# Patient Record
Sex: Female | Born: 1940 | Race: White | Hispanic: No | Marital: Married | Smoking: Former smoker
Health system: Southern US, Community
[De-identification: ages and names within clinical notes are randomized; demographics above are authoritative.]

## PROBLEM LIST (undated history)

## (undated) DIAGNOSIS — E039 Hypothyroidism, unspecified: Secondary | ICD-10-CM

## (undated) DIAGNOSIS — R519 Headache, unspecified: Secondary | ICD-10-CM

## (undated) DIAGNOSIS — R112 Nausea with vomiting, unspecified: Secondary | ICD-10-CM

## (undated) DIAGNOSIS — Z8611 Personal history of tuberculosis: Secondary | ICD-10-CM

## (undated) DIAGNOSIS — R3915 Urgency of urination: Secondary | ICD-10-CM

## (undated) DIAGNOSIS — H353190 Nonexudative age-related macular degeneration, unspecified eye, stage unspecified: Secondary | ICD-10-CM

## (undated) DIAGNOSIS — F419 Anxiety disorder, unspecified: Secondary | ICD-10-CM

## (undated) DIAGNOSIS — Z9889 Other specified postprocedural states: Secondary | ICD-10-CM

## (undated) DIAGNOSIS — M2550 Pain in unspecified joint: Secondary | ICD-10-CM

## (undated) DIAGNOSIS — M542 Cervicalgia: Secondary | ICD-10-CM

## (undated) DIAGNOSIS — F329 Major depressive disorder, single episode, unspecified: Secondary | ICD-10-CM

## (undated) DIAGNOSIS — L570 Actinic keratosis: Secondary | ICD-10-CM

## (undated) DIAGNOSIS — M1712 Unilateral primary osteoarthritis, left knee: Secondary | ICD-10-CM

## (undated) DIAGNOSIS — G629 Polyneuropathy, unspecified: Secondary | ICD-10-CM

## (undated) DIAGNOSIS — E7800 Pure hypercholesterolemia, unspecified: Secondary | ICD-10-CM

## (undated) DIAGNOSIS — Z8669 Personal history of other diseases of the nervous system and sense organs: Secondary | ICD-10-CM

## (undated) DIAGNOSIS — H269 Unspecified cataract: Secondary | ICD-10-CM

## (undated) DIAGNOSIS — R51 Headache: Secondary | ICD-10-CM

## (undated) DIAGNOSIS — Z86010 Personal history of colonic polyps: Secondary | ICD-10-CM

## (undated) DIAGNOSIS — R42 Dizziness and giddiness: Secondary | ICD-10-CM

## (undated) HISTORY — PX: COLONOSCOPY: SHX174

## (undated) HISTORY — DX: Actinic keratosis: L57.0

---
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
## 1967-12-18 HISTORY — PX: ECTOPIC PREGNANCY SURGERY: SHX613

## 1970-12-17 HISTORY — PX: OVARIAN CYST REMOVAL: SHX89

## 1975-12-18 HISTORY — PX: VAGINAL HYSTERECTOMY: SHX2639

## 1982-12-17 HISTORY — PX: CHOLECYSTECTOMY: SHX55

## 2001-04-14 ENCOUNTER — Encounter: Payer: Self-pay | Admitting: Internal Medicine

## 2001-04-14 ENCOUNTER — Ambulatory Visit (HOSPITAL_COMMUNITY): Admission: RE | Admit: 2001-04-14 | Discharge: 2001-04-14 | Payer: Self-pay | Admitting: Internal Medicine

## 2001-04-18 ENCOUNTER — Encounter: Payer: Self-pay | Admitting: Internal Medicine

## 2005-12-27 ENCOUNTER — Ambulatory Visit: Payer: Self-pay | Admitting: Specialist

## 2006-04-08 ENCOUNTER — Ambulatory Visit: Payer: Self-pay | Admitting: Specialist

## 2006-12-17 HISTORY — PX: KNEE ARTHROSCOPY W/ MENISCECTOMY: SHX1879

## 2008-06-22 ENCOUNTER — Ambulatory Visit: Payer: Self-pay | Admitting: Specialist

## 2008-09-21 ENCOUNTER — Ambulatory Visit: Payer: Self-pay | Admitting: Specialist

## 2009-06-29 ENCOUNTER — Ambulatory Visit: Payer: Self-pay | Admitting: Gastroenterology

## 2010-01-31 ENCOUNTER — Ambulatory Visit: Payer: Self-pay | Admitting: Internal Medicine

## 2011-09-17 ENCOUNTER — Other Ambulatory Visit: Payer: Self-pay | Admitting: Internal Medicine

## 2011-09-17 MED ORDER — VENLAFAXINE HCL ER 75 MG PO CP24: 75.0000 mg | ORAL_CAPSULE | Freq: Every day | ORAL | Status: DC

## 2011-11-13 ENCOUNTER — Ambulatory Visit: Payer: Self-pay | Admitting: Specialist

## 2012-02-12 ENCOUNTER — Other Ambulatory Visit: Payer: Self-pay | Admitting: Internal Medicine

## 2012-02-12 MED ORDER — VENLAFAXINE HCL ER 75 MG PO CP24: 75.0000 mg | ORAL_CAPSULE | Freq: Every day | ORAL | Status: DC

## 2012-02-14 ENCOUNTER — Other Ambulatory Visit: Payer: Self-pay | Admitting: Internal Medicine

## 2012-06-25 ENCOUNTER — Other Ambulatory Visit: Payer: Self-pay | Admitting: Internal Medicine

## 2012-12-01 ENCOUNTER — Other Ambulatory Visit: Payer: Self-pay | Admitting: Internal Medicine

## 2013-04-17 ENCOUNTER — Ambulatory Visit: Payer: Self-pay | Admitting: Specialist

## 2013-04-28 ENCOUNTER — Ambulatory Visit: Payer: Self-pay | Admitting: Specialist

## 2014-04-24 ENCOUNTER — Observation Stay: Payer: Self-pay | Admitting: Internal Medicine

## 2014-04-24 LAB — COMPREHENSIVE METABOLIC PANEL
AST: 34 U/L (ref 15–37)
Albumin: 3.3 g/dL — ABNORMAL LOW (ref 3.4–5.0)
Alkaline Phosphatase: 64 U/L
Anion Gap: 6 — ABNORMAL LOW (ref 7–16)
BILIRUBIN TOTAL: 0.4 mg/dL (ref 0.2–1.0)
BUN: 17 mg/dL (ref 7–18)
CO2: 27 mmol/L (ref 21–32)
Calcium, Total: 9.1 mg/dL (ref 8.5–10.1)
Chloride: 108 mmol/L — ABNORMAL HIGH (ref 98–107)
Creatinine: 0.94 mg/dL (ref 0.60–1.30)
EGFR (Non-African Amer.): 60 — ABNORMAL LOW
Glucose: 126 mg/dL — ABNORMAL HIGH (ref 65–99)
OSMOLALITY: 284 (ref 275–301)
Potassium: 4.2 mmol/L (ref 3.5–5.1)
SGPT (ALT): 26 U/L (ref 12–78)
Sodium: 141 mmol/L (ref 136–145)
Total Protein: 6.6 g/dL (ref 6.4–8.2)

## 2014-04-24 LAB — URINALYSIS, COMPLETE
BILIRUBIN, UR: NEGATIVE
BLOOD: NEGATIVE
Glucose,UR: NEGATIVE mg/dL (ref 0–75)
KETONE: NEGATIVE
Nitrite: NEGATIVE
Ph: 6 (ref 4.5–8.0)
Protein: NEGATIVE
SPECIFIC GRAVITY: 1.009 (ref 1.003–1.030)
WBC UR: 1 /HPF (ref 0–5)

## 2014-04-24 LAB — CBC
HCT: 34.6 % — ABNORMAL LOW (ref 35.0–47.0)
HGB: 11.1 g/dL — ABNORMAL LOW (ref 12.0–16.0)
MCH: 29.9 pg (ref 26.0–34.0)
MCHC: 32.1 g/dL (ref 32.0–36.0)
MCV: 93 fL (ref 80–100)
Platelet: 213 10*3/uL (ref 150–440)
RBC: 3.71 10*6/uL — AB (ref 3.80–5.20)
RDW: 13.3 % (ref 11.5–14.5)
WBC: 9 10*3/uL (ref 3.6–11.0)

## 2014-04-24 LAB — TROPONIN I
Troponin-I: <0.02 ng/mL
Troponin-I: <0.02 ng/mL

## 2014-06-16 DIAGNOSIS — R55 Syncope and collapse: Secondary | ICD-10-CM | POA: Diagnosis present

## 2014-09-08 ENCOUNTER — Encounter (HOSPITAL_COMMUNITY): Payer: Self-pay | Admitting: Physician Assistant

## 2014-09-08 DIAGNOSIS — F32A Depression, unspecified: Secondary | ICD-10-CM

## 2014-09-08 DIAGNOSIS — F329 Major depressive disorder, single episode, unspecified: Secondary | ICD-10-CM | POA: Diagnosis present

## 2014-09-08 DIAGNOSIS — M1712 Unilateral primary osteoarthritis, left knee: Secondary | ICD-10-CM | POA: Diagnosis present

## 2014-09-08 DIAGNOSIS — E7800 Pure hypercholesterolemia, unspecified: Secondary | ICD-10-CM | POA: Diagnosis present

## 2014-09-08 DIAGNOSIS — E039 Hypothyroidism, unspecified: Secondary | ICD-10-CM | POA: Diagnosis present

## 2014-09-08 DIAGNOSIS — K648 Other hemorrhoids: Secondary | ICD-10-CM | POA: Diagnosis present

## 2014-09-08 HISTORY — DX: Depression, unspecified: F32.A

## 2014-09-08 NOTE — H&P (Signed)
TOTAL KNEE ADMISSION H&P  Patient is being admitted for left total knee arthroplasty.  Subjective:  Chief Complaint:left knee pain.  HPI: Shelley Sherman, 73 y.o. female, has a history of pain and functional disability in the left knee due to arthritis and has failed non-surgical conservative treatments for greater than 12 weeks to includeNSAID's and/or analgesics, corticosteriod injections, viscosupplementation injections, flexibility and strengthening excercises, supervised PT with diminished ADL's post treatment, use of assistive devices, weight reduction as appropriate and activity modification.  Onset of symptoms was gradual, starting 10 years ago with gradually worsening course since that time. The patient noted prior procedures on the knee to include  arthroscopy and menisectomy on the left knee(s).  Patient currently rates pain in the left knee(s) at 10 out of 10 with activity. Patient has night pain, worsening of pain with activity and weight bearing, pain that interferes with activities of daily living, pain with passive range of motion, crepitus and joint swelling.  Patient has evidence of subchondral sclerosis, periarticular osteophytes and joint space narrowing by imaging studies. There is no active infection.  Patient Active Problem List   Diagnosis Date Noted  . Depression 09/08/2014  . Primary localized osteoarthritis of left knee   . Internal hemorrhoid   . Hypercholesteremia   . Hypothyroidism   . Syncope and collapse 06/16/2014   Past Medical History  Diagnosis Date  . Hypothyroidism   . Hypercholesteremia   . Anxiety   . Cataract   . Internal hemorrhoid   . Syncope and collapse July 2015    Complete work up negative except for dehydration  . Primary localized osteoarthritis of left knee   . Depression 09/08/2014    Past Surgical History  Procedure Laterality Date  . Tonsilectomy, adenoidectomy, bilateral myringotomy and tubes  1947  . Ectopic pregnancy surgery   1969  . Ovarian cyst removal  1972  . Vaginal hysterectomy  1977  . Cholecystectomy  1984  . Knee arthroscopy w/ meniscectomy Left 2008    No prescriptions prior to admission   Allergies  Allergen Reactions  . Sulfa Antibiotics Rash    History  Substance Use Topics  . Smoking status: Current Every Day Smoker -- 0.25 packs/day    Types: Cigarettes  . Smokeless tobacco: Not on file  . Alcohol Use: Not on file     Comment: occasionally    Family History  Problem Relation Age of Onset  . Seizures Sister      Review of Systems  Constitutional: Negative.   Eyes: Negative.   Cardiovascular: Negative.   Gastrointestinal: Negative.   Genitourinary: Negative.   Musculoskeletal: Positive for joint pain.  Skin: Negative.   Neurological: Negative.   Endo/Heme/Allergies: Negative.   Psychiatric/Behavioral: Negative.     Objective:  Physical Exam  Constitutional: She is oriented to person, place, and time. She appears well-developed and well-nourished.  HENT:  Head: Normocephalic and atraumatic.  Mouth/Throat: Oropharynx is clear and moist.  Eyes: Conjunctivae and EOM are normal. Pupils are equal, round, and reactive to light.  Neck: Neck supple.  Cardiovascular: Normal rate, regular rhythm and normal heart sounds.   Respiratory: Effort normal and breath sounds normal.  GI: Soft. Bowel sounds are normal.  Genitourinary:  Not pertinent to current symptomatology therefore not examined.  Musculoskeletal:  Examination of her right knee reveals pain medially and laterally mild varus deformity 1+ crepitation 1+ synovitis range of motion -5 to 125 degrees knee is stable with normal patella tracking. Exam of her left  knee reveal moderate pain minimal swelling full range of motion knee is stable with normal patella tracking. Vascular exam: pulses 2+ and symmetric.  Neurological: She is alert and oriented to person, place, and time.  Skin: Skin is warm and dry.  Psychiatric: She has a  normal mood and affect. Her behavior is normal.    Vital signs in last 24 hours: Temp:  [97.8 F (36.6 C)] 97.8 F (36.6 C) (09/23 0900) Pulse Rate:  [73] 73 (09/23 0900) BP: (121)/(82) 121/82 mmHg (09/23 0900) SpO2:  [98 %] 98 % (09/23 0900) Weight:  [72.122 kg (159 lb)] 72.122 kg (159 lb) (09/23 0900)  Labs:   Estimated body mass index is 25.68 kg/(m^2) as calculated from the following:   Height as of this encounter: 5\' 6"  (1.676 m).   Weight as of this encounter: 72.122 kg (159 lb).   Imaging Review Plain radiographs demonstrate severe degenerative joint disease of the left knee(s). The overall alignment ismild varus. The bone quality appears to be good for age and reported activity level.  Assessment/Plan:  End stage arthritis, left knee  Principal Problem:   Primary localized osteoarthritis of left knee Active Problems:   Syncope and collapse   Internal hemorrhoid   Hypercholesteremia   Hypothyroidism   Depression  The patient history, physical examination, clinical judgment of the provider and imaging studies are consistent with end stage degenerative joint disease of the left knee(s) and total knee arthroplasty is deemed medically necessary. The treatment options including medical management, injection therapy arthroscopy and arthroplasty were discussed at length. The risks and benefits of total knee arthroplasty were presented and reviewed. The risks due to aseptic loosening, infection, stiffness, patella tracking problems, thromboembolic complications and other imponderables were discussed. The patient acknowledged the explanation, agreed to proceed with the plan and consent was signed. Patient is being admitted for inpatient treatment for surgery, pain control, PT, OT, prophylactic antibiotics, VTE prophylaxis, progressive ambulation and ADL's and discharge planning. The patient is planning to be discharged home with home health services  Wolfgang Finigan A. Kaleen Mask Physician Assistant [facility] [phone]  09/08/2014, 9:56 AM

## 2014-09-10 ENCOUNTER — Ambulatory Visit (HOSPITAL_COMMUNITY)
Admission: RE | Admit: 2014-09-10 | Discharge: 2014-09-10 | Disposition: A | Discharge status: Home / Self Care | Payer: Medicare Other | Source: Ambulatory Visit | Attending: Physician Assistant | Admitting: Physician Assistant

## 2014-09-10 ENCOUNTER — Encounter (HOSPITAL_COMMUNITY): Payer: Self-pay | Admitting: Pharmacy Technician

## 2014-09-10 ENCOUNTER — Encounter (HOSPITAL_COMMUNITY): Payer: Self-pay

## 2014-09-10 ENCOUNTER — Encounter (HOSPITAL_COMMUNITY)
Admission: RE | Admit: 2014-09-10 | Discharge: 2014-09-10 | Disposition: A | Discharge status: Home / Self Care | Payer: Medicare Other | Source: Ambulatory Visit | Attending: Orthopedic Surgery | Admitting: Orthopedic Surgery

## 2014-09-10 DIAGNOSIS — Z01818 Encounter for other preprocedural examination: Secondary | ICD-10-CM | POA: Insufficient documentation

## 2014-09-10 HISTORY — DX: Other specified postprocedural states: R11.2

## 2014-09-10 HISTORY — DX: Nonexudative age-related macular degeneration, unspecified eye, stage unspecified: H35.3190

## 2014-09-10 HISTORY — DX: Unspecified cataract: H26.9

## 2014-09-10 HISTORY — DX: Other specified postprocedural states: Z98.890

## 2014-09-10 HISTORY — DX: Personal history of other diseases of the nervous system and sense organs: Z86.69

## 2014-09-10 HISTORY — DX: Pain in unspecified joint: M25.50

## 2014-09-10 HISTORY — DX: Urgency of urination: R39.15

## 2014-09-10 HISTORY — DX: Personal history of colonic polyps: Z86.010

## 2014-09-10 HISTORY — DX: Dizziness and giddiness: R42

## 2014-09-10 HISTORY — DX: Polyneuropathy, unspecified: G62.9

## 2014-09-10 LAB — COMPREHENSIVE METABOLIC PANEL
ALT: 19 U/L (ref 0–35)
AST: 20 U/L (ref 0–37)
Albumin: 4.3 g/dL (ref 3.5–5.2)
Alkaline Phosphatase: 82 U/L (ref 39–117)
Anion gap: 12 (ref 5–15)
BUN: 14 mg/dL (ref 6–23)
CALCIUM: 9.9 mg/dL (ref 8.4–10.5)
CO2: 28 meq/L (ref 19–32)
CREATININE: 0.72 mg/dL (ref 0.50–1.10)
Chloride: 101 mEq/L (ref 96–112)
GFR calc Af Amer: >90 mL/min (ref >90)
GFR, EST NON AFRICAN AMERICAN: 83 mL/min — AB (ref >90)
Glucose, Bld: 92 mg/dL (ref 70–99)
Potassium: 3.9 mEq/L (ref 3.7–5.3)
Sodium: 141 mEq/L (ref 137–147)
Total Bilirubin: 0.5 mg/dL (ref 0.3–1.2)
Total Protein: 7.6 g/dL (ref 6.0–8.3)

## 2014-09-10 LAB — URINALYSIS, ROUTINE W REFLEX MICROSCOPIC
Bilirubin Urine: NEGATIVE
GLUCOSE, UA: NEGATIVE mg/dL
Hgb urine dipstick: NEGATIVE
KETONES UR: NEGATIVE mg/dL
Nitrite: NEGATIVE
Protein, ur: NEGATIVE mg/dL
Specific Gravity, Urine: 1.014 (ref 1.005–1.030)
Urobilinogen, UA: 0.2 mg/dL (ref 0.0–1.0)
pH: 5 (ref 5.0–8.0)

## 2014-09-10 LAB — CBC WITH DIFFERENTIAL/PLATELET
Basophils Absolute: 0 10*3/uL (ref 0.0–0.1)
Basophils Relative: 0 % (ref 0–1)
EOS PCT: 2 % (ref 0–5)
Eosinophils Absolute: 0.1 10*3/uL (ref 0.0–0.7)
HEMATOCRIT: 40.9 % (ref 36.0–46.0)
Hemoglobin: 13.6 g/dL (ref 12.0–15.0)
LYMPHS ABS: 3.5 10*3/uL (ref 0.7–4.0)
LYMPHS PCT: 40 % (ref 12–46)
MCH: 30.6 pg (ref 26.0–34.0)
MCHC: 33.3 g/dL (ref 30.0–36.0)
MCV: 91.9 fL (ref 78.0–100.0)
MONO ABS: 0.7 10*3/uL (ref 0.1–1.0)
MONOS PCT: 8 % (ref 3–12)
Neutro Abs: 4.4 10*3/uL (ref 1.7–7.7)
Neutrophils Relative %: 50 % (ref 43–77)
Platelets: 249 10*3/uL (ref 150–400)
RBC: 4.45 MIL/uL (ref 3.87–5.11)
RDW: 13.1 % (ref 11.5–15.5)
WBC: 8.7 10*3/uL (ref 4.0–10.5)

## 2014-09-10 LAB — SURGICAL PCR SCREEN
MRSA, PCR: NEGATIVE
Staphylococcus aureus: NEGATIVE

## 2014-09-10 LAB — URINE MICROSCOPIC-ADD ON

## 2014-09-10 LAB — APTT: aPTT: 34 seconds (ref 24–37)

## 2014-09-10 LAB — TYPE AND SCREEN
ABO/RH(D): A POS
Antibody Screen: NEGATIVE

## 2014-09-10 LAB — PROTIME-INR
INR: 1.03 (ref 0.00–1.49)
PROTHROMBIN TIME: 13.5 s (ref 11.6–15.2)

## 2014-09-10 LAB — ABO/RH: ABO/RH(D): A POS

## 2014-09-10 MED ORDER — POVIDONE-IODINE 7.5 % EX SOLN: Freq: Once | CUTANEOUS | Status: DC

## 2014-09-10 MED ORDER — CHLORHEXIDINE GLUCONATE 4 % EX LIQD: 60.0000 mL | Freq: Once | CUTANEOUS | Status: DC

## 2014-09-10 MED ORDER — CEFAZOLIN SODIUM-DEXTROSE 2-3 GM-% IV SOLR: 2.0000 g | INTRAVENOUS | Status: DC

## 2014-09-10 NOTE — Pre-Procedure Instructions (Signed)
ELANA JIAN  09/10/2014   Your procedure is scheduled on:  Mon, Oct 5 @ 7:15 AM  Report to [facility]  at 5:30 AM.  Call this number if you have problems the morning of surgery: [phone]   Remember:   Do not eat food or drink liquids after midnight.   Take these medicines the morning of surgery with A SIP OF WATER: Synthroid(Levothyroxine) and Effexor(Venlafaxine)              Stop taking your Vitamins. No Goody's,BC's,Aleve,Aspirin,Ibuprofen,Fish Oil,or any Herbal Medications    Do not wear jewelry, make-up or nail polish.  Do not wear lotions, powders, or perfumes. You may wear deodorant.  Do not shave 48 hours prior to surgery.   Do not bring valuables to the hospital.  [facility] is not responsible                  for any belongings or valuables.               Contacts, dentures or bridgework may not be worn into surgery.  Leave suitcase in the car. After surgery it may be brought to your room.  For patients admitted to the hospital, discharge time is determined by your                treatment team.               Special Instructions:  [facility] - Preparing for Surgery  Before surgery, you can play an important role.  Because skin is not sterile, your skin needs to be as free of germs as possible.  You can reduce the number of germs on you skin by washing with CHG (chlorahexidine gluconate) soap before surgery.  CHG is an antiseptic cleaner which kills germs and bonds with the skin to continue killing germs even after washing.  Please DO NOT use if you have an allergy to CHG or antibacterial soaps.  If your skin becomes reddened/irritated stop using the CHG and inform your nurse when you arrive at Short Stay.  Do not shave (including legs and underarms) for at least 48 hours prior to the first CHG shower.  You may shave your face.  Please follow these instructions carefully:   1.  Shower with CHG Soap the night before surgery and the                                 morning of Surgery.  2.  If you choose to wash your hair, wash your hair first as usual with your       normal shampoo.  3.  After you shampoo, rinse your hair and body thoroughly to remove the                      Shampoo.  4.  Use CHG as you would any other liquid soap.  You can apply chg directly       to the skin and wash gently with scrungie or a clean washcloth.  5.  Apply the CHG Soap to your body ONLY FROM THE NECK DOWN.        Do not use on open wounds or open sores.  Avoid contact with your eyes,       ears, mouth and genitals (private parts).  Wash genitals (private parts)       with  your normal soap.  6.  Wash thoroughly, paying special attention to the area where your surgery        will be performed.  7.  Thoroughly rinse your body with warm water from the neck down.  8.  DO NOT shower/wash with your normal soap after using and rinsing off       the CHG Soap.  9.  Pat yourself dry with a clean towel.            10.  Wear clean pajamas.            11.  Place clean sheets on your bed the night of your first shower and do not        sleep with pets.  Day of Surgery  Do not apply any lotions/deoderants the morning of surgery.  Please wear clean clothes to the hospital/surgery center.     Please read over the following fact sheets that you were given: Pain Booklet, Coughing and Deep Breathing, Blood Transfusion Information, MRSA Information and Surgical Site Infection Prevention

## 2014-09-10 NOTE — Progress Notes (Addendum)
Pt doesn't have a cardiologist  Denies ever having an echo/stress test/heart cath  Dr.Javed Masoud is Medical Md  Denies CXR in past yr  EKG done 04-24-14 at [facility] but not showing up in epic to request

## 2014-09-11 LAB — URINE CULTURE
CULTURE: NO GROWTH
Colony Count: NO GROWTH

## 2014-09-19 MED ORDER — LACTATED RINGERS IV SOLN: INTRAVENOUS | Status: DC

## 2014-09-20 ENCOUNTER — Inpatient Hospital Stay (HOSPITAL_COMMUNITY): Payer: Medicare Other | Admitting: Anesthesiology

## 2014-09-20 ENCOUNTER — Encounter (HOSPITAL_COMMUNITY): Payer: Medicare Other | Admitting: Anesthesiology

## 2014-09-20 ENCOUNTER — Encounter (HOSPITAL_COMMUNITY)
Admission: RE | Disposition: A | Discharge status: Home Health | Payer: Self-pay | Source: Ambulatory Visit | Attending: Orthopedic Surgery

## 2014-09-20 ENCOUNTER — Inpatient Hospital Stay (HOSPITAL_COMMUNITY)
Admission: RE | Admit: 2014-09-20 | Discharge: 2014-09-21 | DRG: 470 | Disposition: A | Discharge status: Home Health | Payer: Medicare Other | Source: Ambulatory Visit | Attending: Orthopedic Surgery | Admitting: Orthopedic Surgery

## 2014-09-20 ENCOUNTER — Encounter (HOSPITAL_COMMUNITY): Payer: Self-pay | Admitting: Anesthesiology

## 2014-09-20 DIAGNOSIS — G629 Polyneuropathy, unspecified: Secondary | ICD-10-CM | POA: Diagnosis present

## 2014-09-20 DIAGNOSIS — K648 Other hemorrhoids: Secondary | ICD-10-CM | POA: Diagnosis present

## 2014-09-20 DIAGNOSIS — M1711 Unilateral primary osteoarthritis, right knee: Secondary | ICD-10-CM | POA: Diagnosis present

## 2014-09-20 DIAGNOSIS — E7800 Pure hypercholesterolemia, unspecified: Secondary | ICD-10-CM | POA: Diagnosis present

## 2014-09-20 DIAGNOSIS — E039 Hypothyroidism, unspecified: Secondary | ICD-10-CM | POA: Diagnosis present

## 2014-09-20 DIAGNOSIS — F329 Major depressive disorder, single episode, unspecified: Secondary | ICD-10-CM | POA: Diagnosis present

## 2014-09-20 DIAGNOSIS — M1710 Unilateral primary osteoarthritis, unspecified knee: Secondary | ICD-10-CM | POA: Diagnosis present

## 2014-09-20 DIAGNOSIS — F17210 Nicotine dependence, cigarettes, uncomplicated: Secondary | ICD-10-CM | POA: Diagnosis present

## 2014-09-20 DIAGNOSIS — R55 Syncope and collapse: Secondary | ICD-10-CM | POA: Diagnosis present

## 2014-09-20 DIAGNOSIS — Z881 Allergy status to other antibiotic agents status: Secondary | ICD-10-CM

## 2014-09-20 DIAGNOSIS — M1712 Unilateral primary osteoarthritis, left knee: Secondary | ICD-10-CM | POA: Diagnosis present

## 2014-09-20 DIAGNOSIS — F32A Depression, unspecified: Secondary | ICD-10-CM | POA: Diagnosis present

## 2014-09-20 DIAGNOSIS — F419 Anxiety disorder, unspecified: Secondary | ICD-10-CM | POA: Diagnosis present

## 2014-09-20 HISTORY — PX: TOTAL KNEE ARTHROPLASTY: SHX125

## 2014-09-20 HISTORY — DX: Pure hypercholesterolemia, unspecified: E78.00

## 2014-09-20 HISTORY — DX: Unilateral primary osteoarthritis, left knee: M17.12

## 2014-09-20 HISTORY — DX: Hypothyroidism, unspecified: E03.9

## 2014-09-20 HISTORY — DX: Major depressive disorder, single episode, unspecified: F32.9

## 2014-09-20 HISTORY — DX: Anxiety disorder, unspecified: F41.9

## 2014-09-20 LAB — CREATININE, SERUM
Creatinine, Ser: 0.72 mg/dL (ref 0.50–1.10)
GFR calc Af Amer: >90 mL/min (ref >90)
GFR calc non Af Amer: 83 mL/min — ABNORMAL LOW (ref >90)

## 2014-09-20 LAB — CBC
HEMATOCRIT: 35.1 % — AB (ref 36.0–46.0)
Hemoglobin: 11.6 g/dL — ABNORMAL LOW (ref 12.0–15.0)
MCH: 30.5 pg (ref 26.0–34.0)
MCHC: 33 g/dL (ref 30.0–36.0)
MCV: 92.4 fL (ref 78.0–100.0)
PLATELETS: 225 10*3/uL (ref 150–400)
RBC: 3.8 MIL/uL — AB (ref 3.87–5.11)
RDW: 13 % (ref 11.5–15.5)
WBC: 11.4 10*3/uL — AB (ref 4.0–10.5)

## 2014-09-20 SURGERY — ARTHROPLASTY, KNEE, TOTAL
Anesthesia: Regional | Site: Knee | Laterality: Left

## 2014-09-20 MED ORDER — VENLAFAXINE HCL ER 75 MG PO CP24
75.0000 mg | ORAL_CAPSULE | Freq: Every day | ORAL | Status: DC
  Administered 2014-09-20 – 2014-09-21 (×2): 75 mg via ORAL
  Filled 2014-09-20 (×3): qty 1

## 2014-09-20 MED ORDER — CEFAZOLIN SODIUM-DEXTROSE 2-3 GM-% IV SOLR
INTRAVENOUS | Status: DC | PRN
  Administered 2014-09-20: 2 g via INTRAVENOUS

## 2014-09-20 MED ORDER — FENTANYL CITRATE 0.05 MG/ML IJ SOLN
INTRAMUSCULAR | Status: DC | PRN
  Administered 2014-09-20 (×3): 50 ug via INTRAVENOUS
  Administered 2014-09-20: 100 ug via INTRAVENOUS

## 2014-09-20 MED ORDER — ACETAMINOPHEN 325 MG PO TABS: 650.0000 mg | ORAL_TABLET | Freq: Four times a day (QID) | ORAL | Status: DC | PRN

## 2014-09-20 MED ORDER — PNEUMOCOCCAL VAC POLYVALENT 25 MCG/0.5ML IJ INJ
0.5000 mL | INJECTION | INTRAMUSCULAR | Status: DC
  Filled 2014-09-20: qty 0.5

## 2014-09-20 MED ORDER — PHENOL 1.4 % MT LIQD: 1.0000 | OROMUCOSAL | Status: DC | PRN

## 2014-09-20 MED ORDER — DEXAMETHASONE SODIUM PHOSPHATE 10 MG/ML IJ SOLN
10.0000 mg | Freq: Three times a day (TID) | INTRAMUSCULAR | Status: DC
  Filled 2014-09-20 (×3): qty 1

## 2014-09-20 MED ORDER — ROCURONIUM BROMIDE 50 MG/5ML IV SOLN
INTRAVENOUS | Status: AC
  Filled 2014-09-20: qty 1

## 2014-09-20 MED ORDER — KETOROLAC TROMETHAMINE 15 MG/ML IJ SOLN
INTRAMUSCULAR | Status: DC | PRN
  Administered 2014-09-20: 15 mg via INTRAVENOUS

## 2014-09-20 MED ORDER — DEXAMETHASONE SODIUM PHOSPHATE 10 MG/ML IJ SOLN
INTRAMUSCULAR | Status: AC
  Filled 2014-09-20: qty 1

## 2014-09-20 MED ORDER — METOCLOPRAMIDE HCL 10 MG PO TABS: 5.0000 mg | ORAL_TABLET | Freq: Three times a day (TID) | ORAL | Status: DC | PRN

## 2014-09-20 MED ORDER — MIDAZOLAM HCL 5 MG/5ML IJ SOLN
INTRAMUSCULAR | Status: DC | PRN
  Administered 2014-09-20: 1 mg via INTRAVENOUS

## 2014-09-20 MED ORDER — BUPIVACAINE HCL (PF) 0.5 % IJ SOLN
INTRAMUSCULAR | Status: DC | PRN
  Administered 2014-09-20: 30 mL via PERINEURAL

## 2014-09-20 MED ORDER — INFLUENZA VAC SPLIT QUAD 0.5 ML IM SUSY
0.5000 mL | PREFILLED_SYRINGE | INTRAMUSCULAR | Status: AC
  Administered 2014-09-21: 0.5 mL via INTRAMUSCULAR
  Filled 2014-09-20: qty 0.5

## 2014-09-20 MED ORDER — ENOXAPARIN SODIUM 30 MG/0.3ML ~~LOC~~ SOLN
30.0000 mg | Freq: Two times a day (BID) | SUBCUTANEOUS | Status: DC
  Administered 2014-09-21: 30 mg via SUBCUTANEOUS
  Filled 2014-09-20 (×3): qty 0.3

## 2014-09-20 MED ORDER — METOCLOPRAMIDE HCL 5 MG/ML IJ SOLN: 5.0000 mg | Freq: Three times a day (TID) | INTRAMUSCULAR | Status: DC | PRN

## 2014-09-20 MED ORDER — CALCIUM CARBONATE 1250 (500 CA) MG PO TABS
1.0000 | ORAL_TABLET | Freq: Every day | ORAL | Status: DC
  Administered 2014-09-21: 500 mg via ORAL
  Filled 2014-09-20 (×2): qty 1

## 2014-09-20 MED ORDER — KETOROLAC TROMETHAMINE 15 MG/ML IJ SOLN
7.5000 mg | Freq: Four times a day (QID) | INTRAMUSCULAR | Status: AC
Start: 2014-09-20 — End: 2014-09-21
  Administered 2014-09-20 – 2014-09-21 (×3): 7.5 mg via INTRAVENOUS

## 2014-09-20 MED ORDER — PHENYLEPHRINE HCL 10 MG/ML IJ SOLN
10.0000 mg | INTRAVENOUS | Status: DC | PRN
  Administered 2014-09-20: 20 ug/min via INTRAVENOUS

## 2014-09-20 MED ORDER — HYDROMORPHONE HCL 1 MG/ML IJ SOLN
0.2500 mg | INTRAMUSCULAR | Status: DC | PRN
  Administered 2014-09-20 (×4): 0.5 mg via INTRAVENOUS

## 2014-09-20 MED ORDER — SODIUM CHLORIDE 0.9 % IR SOLN
Status: DC | PRN
  Administered 2014-09-20: 3000 mL

## 2014-09-20 MED ORDER — MAGNESIUM OXIDE 400 (241.3 MG) MG PO TABS
200.0000 mg | ORAL_TABLET | Freq: Every day | ORAL | Status: DC
  Administered 2014-09-20 – 2014-09-21 (×2): 200 mg via ORAL
  Filled 2014-09-20 (×2): qty 0.5

## 2014-09-20 MED ORDER — MENTHOL 3 MG MT LOZG: 1.0000 | LOZENGE | OROMUCOSAL | Status: DC | PRN

## 2014-09-20 MED ORDER — CALCIUM CARBONATE 1250 (500 CA) MG PO CAPS
1250.0000 mg | ORAL_CAPSULE | Freq: Every day | ORAL | Status: DC
  Filled 2014-09-20: qty 1

## 2014-09-20 MED ORDER — CEFAZOLIN SODIUM 1-5 GM-% IV SOLN
1.0000 g | Freq: Four times a day (QID) | INTRAVENOUS | Status: AC
  Administered 2014-09-20 (×2): 1 g via INTRAVENOUS
  Filled 2014-09-20 (×2): qty 50

## 2014-09-20 MED ORDER — HYDROXYZINE HCL 25 MG PO TABS: 25.0000 mg | ORAL_TABLET | Freq: Three times a day (TID) | ORAL | Status: DC | PRN

## 2014-09-20 MED ORDER — EPHEDRINE SULFATE 50 MG/ML IJ SOLN
INTRAMUSCULAR | Status: AC
  Filled 2014-09-20: qty 1

## 2014-09-20 MED ORDER — LACTATED RINGERS IV SOLN
INTRAVENOUS | Status: DC | PRN
  Administered 2014-09-20 (×2): via INTRAVENOUS

## 2014-09-20 MED ORDER — PRAVASTATIN SODIUM 40 MG PO TABS
40.0000 mg | ORAL_TABLET | Freq: Every day | ORAL | Status: DC
  Administered 2014-09-20: 40 mg via ORAL
  Filled 2014-09-20 (×2): qty 1

## 2014-09-20 MED ORDER — HYDROMORPHONE HCL 1 MG/ML IJ SOLN
INTRAMUSCULAR | Status: AC
  Filled 2014-09-20: qty 1

## 2014-09-20 MED ORDER — KETOROLAC TROMETHAMINE 15 MG/ML IJ SOLN
INTRAMUSCULAR | Status: AC
  Filled 2014-09-20: qty 1

## 2014-09-20 MED ORDER — OCUVITE PO TABS
1.0000 | ORAL_TABLET | Freq: Every day | ORAL | Status: DC
  Administered 2014-09-20 – 2014-09-21 (×2): 1 via ORAL
  Filled 2014-09-20 (×2): qty 1

## 2014-09-20 MED ORDER — SUCCINYLCHOLINE CHLORIDE 20 MG/ML IJ SOLN
INTRAMUSCULAR | Status: AC
  Filled 2014-09-20: qty 1

## 2014-09-20 MED ORDER — GLYCOPYRROLATE 0.2 MG/ML IJ SOLN
INTRAMUSCULAR | Status: DC | PRN
  Administered 2014-09-20: .4 mg via INTRAVENOUS

## 2014-09-20 MED ORDER — LEVOTHYROXINE SODIUM 75 MCG PO TABS
75.0000 ug | ORAL_TABLET | Freq: Every day | ORAL | Status: DC
  Administered 2014-09-21: 75 ug via ORAL
  Filled 2014-09-20 (×2): qty 1

## 2014-09-20 MED ORDER — SODIUM CHLORIDE 0.9 % IJ SOLN
INTRAMUSCULAR | Status: AC
  Filled 2014-09-20: qty 10

## 2014-09-20 MED ORDER — BUPIVACAINE-EPINEPHRINE 0.25% -1:200000 IJ SOLN
INTRAMUSCULAR | Status: DC | PRN
  Administered 2014-09-20: 30 mL

## 2014-09-20 MED ORDER — POTASSIUM CHLORIDE IN NACL 20-0.9 MEQ/L-% IV SOLN
INTRAVENOUS | Status: DC
  Administered 2014-09-20 – 2014-09-21 (×2): via INTRAVENOUS
  Filled 2014-09-20 (×5): qty 1000

## 2014-09-20 MED ORDER — MAGNESIUM 250 MG PO TABS: 250.0000 mg | ORAL_TABLET | Freq: Every day | ORAL | Status: DC

## 2014-09-20 MED ORDER — 0.9 % SODIUM CHLORIDE (POUR BTL) OPTIME
TOPICAL | Status: DC | PRN
  Administered 2014-09-20: 1000 mL

## 2014-09-20 MED ORDER — ONDANSETRON HCL 4 MG/2ML IJ SOLN
INTRAMUSCULAR | Status: AC
  Filled 2014-09-20: qty 2

## 2014-09-20 MED ORDER — LIDOCAINE HCL (CARDIAC) 20 MG/ML IV SOLN
INTRAVENOUS | Status: AC
  Filled 2014-09-20: qty 5

## 2014-09-20 MED ORDER — DEXAMETHASONE SODIUM PHOSPHATE 10 MG/ML IJ SOLN
INTRAMUSCULAR | Status: DC | PRN
  Administered 2014-09-20: 10 mg via INTRAVENOUS

## 2014-09-20 MED ORDER — NEOSTIGMINE METHYLSULFATE 10 MG/10ML IV SOLN
INTRAVENOUS | Status: DC | PRN
  Administered 2014-09-20: 3 mg via INTRAVENOUS

## 2014-09-20 MED ORDER — ACETAMINOPHEN 650 MG RE SUPP: 650.0000 mg | Freq: Four times a day (QID) | RECTAL | Status: DC | PRN

## 2014-09-20 MED ORDER — KETOROLAC TROMETHAMINE 30 MG/ML IJ SOLN
INTRAMUSCULAR | Status: AC
  Filled 2014-09-20: qty 1

## 2014-09-20 MED ORDER — BUPIVACAINE-EPINEPHRINE (PF) 0.25% -1:200000 IJ SOLN
INTRAMUSCULAR | Status: AC
  Filled 2014-09-20: qty 30

## 2014-09-20 MED ORDER — LIDOCAINE HCL (CARDIAC) 20 MG/ML IV SOLN
INTRAVENOUS | Status: DC | PRN
  Administered 2014-09-20: 70 mg via INTRAVENOUS

## 2014-09-20 MED ORDER — DIPHENHYDRAMINE HCL 12.5 MG/5ML PO ELIX
12.5000 mg | ORAL_SOLUTION | ORAL | Status: DC | PRN
  Administered 2014-09-20: 25 mg via ORAL
  Filled 2014-09-20: qty 10

## 2014-09-20 MED ORDER — FENTANYL CITRATE 0.05 MG/ML IJ SOLN
INTRAMUSCULAR | Status: AC
  Filled 2014-09-20: qty 5

## 2014-09-20 MED ORDER — ALUM & MAG HYDROXIDE-SIMETH 200-200-20 MG/5ML PO SUSP: 30.0000 mL | ORAL | Status: DC | PRN

## 2014-09-20 MED ORDER — POLYETHYLENE GLYCOL 3350 17 G PO PACK: 17.0000 g | PACK | Freq: Every day | ORAL | Status: DC | PRN

## 2014-09-20 MED ORDER — PROMETHAZINE HCL 25 MG/ML IJ SOLN: 6.2500 mg | INTRAMUSCULAR | Status: DC | PRN

## 2014-09-20 MED ORDER — PROPOFOL 10 MG/ML IV BOLUS
INTRAVENOUS | Status: AC
  Filled 2014-09-20: qty 20

## 2014-09-20 MED ORDER — MIDAZOLAM HCL 2 MG/2ML IJ SOLN
INTRAMUSCULAR | Status: AC
  Filled 2014-09-20: qty 2

## 2014-09-20 MED ORDER — ROCURONIUM BROMIDE 100 MG/10ML IV SOLN
INTRAVENOUS | Status: DC | PRN
  Administered 2014-09-20: 40 mg via INTRAVENOUS

## 2014-09-20 MED ORDER — DEXAMETHASONE SODIUM PHOSPHATE 4 MG/ML IJ SOLN
10.0000 mg | Freq: Three times a day (TID) | INTRAMUSCULAR | Status: DC
  Administered 2014-09-20 – 2014-09-21 (×3): 10 mg via INTRAVENOUS
  Filled 2014-09-20 (×6): qty 2.5

## 2014-09-20 MED ORDER — ONDANSETRON HCL 4 MG PO TABS: 4.0000 mg | ORAL_TABLET | Freq: Four times a day (QID) | ORAL | Status: DC | PRN

## 2014-09-20 MED ORDER — PROPOFOL 10 MG/ML IV BOLUS
INTRAVENOUS | Status: DC | PRN
  Administered 2014-09-20: 160 mg via INTRAVENOUS
  Administered 2014-09-20: 40 mg via INTRAVENOUS

## 2014-09-20 MED ORDER — ONDANSETRON HCL 4 MG/2ML IJ SOLN
4.0000 mg | Freq: Four times a day (QID) | INTRAMUSCULAR | Status: DC | PRN
  Administered 2014-09-20: 4 mg via INTRAVENOUS
  Filled 2014-09-20: qty 2

## 2014-09-20 MED ORDER — ALPRAZOLAM 0.25 MG PO TABS: 0.2500 mg | ORAL_TABLET | Freq: Every evening | ORAL | Status: DC | PRN

## 2014-09-20 MED ORDER — MORPHINE SULFATE 2 MG/ML IJ SOLN
2.0000 mg | INTRAMUSCULAR | Status: DC | PRN
  Administered 2014-09-20 – 2014-09-21 (×3): 2 mg via INTRAVENOUS
  Filled 2014-09-20 (×3): qty 1

## 2014-09-20 MED ORDER — ONDANSETRON HCL 4 MG/2ML IJ SOLN
INTRAMUSCULAR | Status: DC | PRN
  Administered 2014-09-20: 4 mg via INTRAVENOUS

## 2014-09-20 MED ORDER — DOCUSATE SODIUM 100 MG PO CAPS
100.0000 mg | ORAL_CAPSULE | Freq: Two times a day (BID) | ORAL | Status: DC
  Administered 2014-09-20 – 2014-09-21 (×3): 100 mg via ORAL
  Filled 2014-09-20 (×3): qty 1

## 2014-09-20 MED ORDER — NEOSTIGMINE METHYLSULFATE 10 MG/10ML IV SOLN
INTRAVENOUS | Status: AC
  Filled 2014-09-20: qty 1

## 2014-09-20 MED ORDER — GLYCOPYRROLATE 0.2 MG/ML IJ SOLN
INTRAMUSCULAR | Status: AC
  Filled 2014-09-20: qty 2

## 2014-09-20 SURGICAL SUPPLY — 71 items
APL SKNCLS STERI-STRIP NONHPOA (GAUZE/BANDAGES/DRESSINGS) ×1
BANDAGE ELASTIC 6 VELCRO ST LF (GAUZE/BANDAGES/DRESSINGS) ×1 IMPLANT
BANDAGE ESMARK 6X9 LF (GAUZE/BANDAGES/DRESSINGS) ×1 IMPLANT
BENZOIN TINCTURE PRP APPL 2/3 (GAUZE/BANDAGES/DRESSINGS) ×2 IMPLANT
BLADE SAGITTAL 25.0X1.19X90 (BLADE) ×2 IMPLANT
BLADE SAW SGTL 11.0X1.19X90.0M (BLADE) IMPLANT
BLADE SAW SGTL 13.0X1.19X90.0M (BLADE) ×2 IMPLANT
BLADE SURG 10 STRL SS (BLADE) ×4 IMPLANT
BNDG CMPR 9X6 STRL LF SNTH (GAUZE/BANDAGES/DRESSINGS) ×1
BNDG CMPR MED 15X6 ELC VLCR LF (GAUZE/BANDAGES/DRESSINGS) ×1
BNDG ELASTIC 6X15 VLCR STRL LF (GAUZE/BANDAGES/DRESSINGS) ×2 IMPLANT
BNDG ESMARK 6X9 LF (GAUZE/BANDAGES/DRESSINGS) ×2
BOWL SMART MIX CTS (DISPOSABLE) ×2 IMPLANT
CAPT RP KNEE ×2 IMPLANT
CEMENT HV SMART SET (Cement) ×4 IMPLANT
CLSR STERI-STRIP ANTIMIC 1/2X4 (GAUZE/BANDAGES/DRESSINGS) ×1 IMPLANT
COVER SURGICAL LIGHT HANDLE (MISCELLANEOUS) ×2 IMPLANT
CUFF TOURNIQUET SINGLE 34IN LL (TOURNIQUET CUFF) ×2 IMPLANT
CUFF TOURNIQUET SINGLE 44IN (TOURNIQUET CUFF) IMPLANT
DRAPE EXTREMITY T 121X128X90 (DRAPE) ×2 IMPLANT
DRAPE INCISE IOBAN 66X45 STRL (DRAPES) ×2 IMPLANT
DRAPE PROXIMA HALF (DRAPES) ×2 IMPLANT
DRAPE U-SHAPE 47X51 STRL (DRAPES) ×2 IMPLANT
DRSG AQUACEL AG ADV 3.5X14 (GAUZE/BANDAGES/DRESSINGS) ×3 IMPLANT
DRSG PAD ABDOMINAL 8X10 ST (GAUZE/BANDAGES/DRESSINGS) ×3 IMPLANT
DURAPREP 26ML APPLICATOR (WOUND CARE) ×4 IMPLANT
ELECT CAUTERY BLADE 6.4 (BLADE) ×2 IMPLANT
ELECT REM PT RETURN 9FT ADLT (ELECTROSURGICAL) ×2
ELECTRODE REM PT RTRN 9FT ADLT (ELECTROSURGICAL) ×1 IMPLANT
EVACUATOR 1/8 PVC DRAIN (DRAIN) ×2 IMPLANT
FACESHIELD WRAPAROUND (MASK) ×2 IMPLANT
FACESHIELD WRAPAROUND OR TEAM (MASK) ×1 IMPLANT
GAUZE SPONGE 4X4 12PLY STRL (GAUZE/BANDAGES/DRESSINGS) ×2 IMPLANT
GLOVE BIO SURGEON STRL SZ7 (GLOVE) ×4 IMPLANT
GLOVE BIOGEL PI IND STRL 7.0 (GLOVE) ×1 IMPLANT
GLOVE BIOGEL PI IND STRL 7.5 (GLOVE) ×1 IMPLANT
GLOVE BIOGEL PI INDICATOR 7.0 (GLOVE) ×1
GLOVE BIOGEL PI INDICATOR 7.5 (GLOVE) ×1
GLOVE SS BIOGEL STRL SZ 7.5 (GLOVE) ×1 IMPLANT
GLOVE SUPERSENSE BIOGEL SZ 7.5 (GLOVE) ×1
GLOVE SURG SS PI 7.0 STRL IVOR (GLOVE) ×2 IMPLANT
GOWN STRL REUS W/ TWL LRG LVL3 (GOWN DISPOSABLE) ×2 IMPLANT
GOWN STRL REUS W/ TWL XL LVL3 (GOWN DISPOSABLE) ×2 IMPLANT
GOWN STRL REUS W/TWL LRG LVL3 (GOWN DISPOSABLE) ×8
GOWN STRL REUS W/TWL XL LVL3 (GOWN DISPOSABLE) ×4
HANDPIECE INTERPULSE COAX TIP (DISPOSABLE) ×2
HOOD PEEL AWAY FACE SHEILD DIS (HOOD) ×4 IMPLANT
IMMOBILIZER KNEE 22 UNIV (SOFTGOODS) ×1 IMPLANT
KIT BASIN OR (CUSTOM PROCEDURE TRAY) ×2 IMPLANT
KIT ROOM TURNOVER OR (KITS) ×2 IMPLANT
MANIFOLD NEPTUNE II (INSTRUMENTS) ×2 IMPLANT
MARKER SKIN DUAL TIP RULER LAB (MISCELLANEOUS) ×2 IMPLANT
NS IRRIG 1000ML POUR BTL (IV SOLUTION) ×2 IMPLANT
PACK TOTAL JOINT (CUSTOM PROCEDURE TRAY) ×2 IMPLANT
PAD ARMBOARD 7.5X6 YLW CONV (MISCELLANEOUS) ×4 IMPLANT
PADDING CAST COTTON 6X4 STRL (CAST SUPPLIES) ×2 IMPLANT
RUBBERBAND STERILE (MISCELLANEOUS) ×2 IMPLANT
SET HNDPC FAN SPRY TIP SCT (DISPOSABLE) ×1 IMPLANT
STRIP CLOSURE SKIN 1/2X4 (GAUZE/BANDAGES/DRESSINGS) ×2 IMPLANT
SUCTION FRAZIER TIP 10 FR DISP (SUCTIONS) ×2 IMPLANT
SUT ETHIBOND NAB CT1 #1 30IN (SUTURE) ×4 IMPLANT
SUT MNCRL AB 3-0 PS2 18 (SUTURE) ×2 IMPLANT
SUT VIC AB 0 CT1 27 (SUTURE) ×4
SUT VIC AB 0 CT1 27XBRD ANBCTR (SUTURE) ×2 IMPLANT
SUT VIC AB 2-0 CT1 27 (SUTURE) ×4
SUT VIC AB 2-0 CT1 TAPERPNT 27 (SUTURE) ×2 IMPLANT
SYR 30ML SLIP (SYRINGE) ×2 IMPLANT
TOWEL OR 17X24 6PK STRL BLUE (TOWEL DISPOSABLE) ×2 IMPLANT
TOWEL OR 17X26 10 PK STRL BLUE (TOWEL DISPOSABLE) ×2 IMPLANT
TRAY FOLEY CATH 16FR SILVER (SET/KITS/TRAYS/PACK) ×2 IMPLANT
WATER STERILE IRR 1000ML POUR (IV SOLUTION) ×4 IMPLANT

## 2014-09-20 NOTE — Interval H&P Note (Signed)
History and Physical Interval Note:  09/20/2014 7:06 AM  Shelley Sherman  has presented today for surgery, with the diagnosis of Primary localized osteoarthritis DJD LEFT KNEE  The various methods of treatment have been discussed with the patient and family. After consideration of risks, benefits and other options for treatment, the patient has consented to  Procedure(s): LEFT TOTAL KNEE ARTHROPLASTY (Left) as a surgical intervention .  The patient's history has been reviewed, patient examined, no change in status, stable for surgery.  I have reviewed the patient's chart and labs.  Questions were answered to the patient's satisfaction.     Elsie Saas A

## 2014-09-20 NOTE — Op Note (Signed)
MRN:     [id number] DOB/AGE:    74-Jun-1942 / 74 y.o.       OPERATIVE REPORT    DATE OF PROCEDURE:  09/20/2014       PREOPERATIVE DIAGNOSIS:   DJD LEFT KNEE      Estimated body mass index is 26.13 kg/(m^2) as calculated from the following:   Height as of this encounter: 5' 5.5" (1.664 m).   Weight as of this encounter: 72.349 kg (159 lb 8 oz).                                                        POSTOPERATIVE DIAGNOSIS:  Primary localized osteoarthritis DJD LEFT KNEE                                                                      PROCEDURE:  Procedure(s): LEFT TOTAL KNEE ARTHROPLASTY Using Depuy Sigma RP implants #2.5 Femur, #3Tibia, 63mm sigma RP bearing, 32 Patella     SURGEON: Sheree Lalla A    ASSISTANT:  Kirstin Shepperson PA-C   (Present and scrubbed throughout the case, critical for assistance with exposure, retraction, instrumentation, and closure.)         ANESTHESIA: GET with Femoral Nerve Block  DRAINS: foley, 2 medium hemovac in knee   TOURNIQUET TIME: 95AOZ   COMPLICATIONS:  None     SPECIMENS: None   INDICATIONS FOR PROCEDURE: The patient has  DJD LEFT KNEE, varus deformities, XR shows bone on bone arthritis. Patient has failed all conservative measures including anti-inflammatory medicines, narcotics, attempts at  exercise and weight loss, cortisone injections and viscosupplementation.  Risks and benefits of surgery have been discussed, questions answered.   DESCRIPTION OF PROCEDURE: The patient identified by armband, received  right femoral nerve block and IV antibiotics, in the holding area at [facility]. Patient taken to the operating room, appropriate anesthetic  monitors were attached General endotracheal anesthesia induced with  the patient in supine position, Foley catheter was inserted. Tourniquet  applied high to the operative thigh. Lateral post and foot positioner  applied to the table, the lower extremity was then prepped and draped  in  usual sterile fashion from the ankle to the tourniquet. Time-out procedure was performed. The limb was wrapped with an Esmarch bandage and the tourniquet inflated to 365 mmHg. We began the operation by making the anterior midline incision starting at handbreadth above the patella going over the patella 1 cm medial to and  4 cm distal to the tibial tubercle. Small bleeders in the skin and the  subcutaneous tissue identified and cauterized. Transverse retinaculum was incised and reflected medially and a medial parapatellar arthrotomy was accomplished. the patella was everted and theprepatellar fat pad resected. The superficial medial collateral  ligament was then elevated from anterior to posterior along the proximal  flare of the tibia and anterior half of the menisci resected. The knee was hyperflexed exposing bone on bone arthritis. Peripheral and notch osteophytes as well as the cruciate ligaments were then resected. We continued to  work our way around posteriorly along  the proximal tibia, and externally  rotated the tibia subluxing it out from underneath the femur. A McHale  retractor was placed through the notch and a lateral Hohmann retractor  placed, and we then drilled through the proximal tibia in line with the  axis of the tibia followed by an intramedullary guide rod and 2-degree  posterior slope cutting guide. The tibial cutting guide was pinned into place  allowing resection of 4 mm of bone medially and about 6 mm of bone  laterally because of her varus deformity. Satisfied with the tibial resection, we then  entered the distal femur 2 mm anterior to the PCL origin with the  intramedullary guide rod and applied the distal femoral cutting guide  set at 15mm, with 5 degrees of valgus. This was pinned along the  epicondylar axis. At this point, the distal femoral cut was accomplished without difficulty. We then sized for a #2.5 femoral component and pinned the guide in 3 degrees of external  rotation.The chamfer cutting guide was pinned into place. The anterior, posterior, and chamfer cuts were accomplished without difficulty followed by  the Sigma RP box cutting guide and the box cut. We also removed posterior osteophytes from the posterior femoral condyles. At this  time, the knee was brought into full extension. We checked our  extension and flexion gaps and found them symmetric at 35mm.  The patella thickness measured at 23 mm. We set the cutting guide at 15 and removed the posterior 9.5-10 mm  of the patella sized for 32 button and drilled the lollipop. The knee  was then once again hyperflexed exposing the proximal tibia. We sized for a #32 tibial base plate, applied the smokestack and the conical reamer followed by the the Delta fin keel punch. We then hammered into place the Sigma RP trial femoral component, inserted a 10-mm trial bearing, trial patellar button, and took the knee through range of motion from 0-130 degrees. No thumb pressure was required for patellar  tracking. At this point, all trial components were removed, a double batch of DePuy HV cement with was mixed and applied to all bony metallic mating surfaces except for the posterior condyles of the femur itself. In order, we  hammered into place the tibial tray and removed excess cement, the femoral component and removed excess cement, a 10-mm Sigma RP bearing  was inserted, and the knee brought to full extension with compression.  The patellar button was clamped into place, and excess cement  removed. While the cement cured the wound was irrigated out with normal saline solution pulse lavage, and medium Hemovac drains were placed.. Ligament stability and patellar tracking were checked and found to be excellent. The tourniquet was then released and hemostasis was obtained with cautery. The parapatellar arthrotomy was closed with  #1 ethibond suture. The subcutaneous tissue with 0 and 2-0 undyed  Vicryl suture, and 4-0  Monocryl.. A dressing of Xeroform,  4 x 4, dressing sponges, Webril, and Ace wrap applied. Needle and sponge count were correct times 2.The patient awakened, extubated, and taken to recovery room without difficulty. Vascular status was normal, pulses 2+ and symmetric.   Johanne Mcglade A 09/20/2014, 8:52 AM

## 2014-09-20 NOTE — Progress Notes (Signed)
Orthopedic Tech Progress Note Patient Details:  Shelley Sherman Jan 06, 1941 [id number] CPM applied to LLE with appropriate settings. OHF applied to bed. CPM Left Knee CPM Left Knee: On Left Knee Flexion (Degrees): 60 Left Knee Extension (Degrees): 0   Asia R Thompson 09/20/2014, 10:36 AM

## 2014-09-20 NOTE — Anesthesia Preprocedure Evaluation (Addendum)
Anesthesia Evaluation  Patient identified by MRN, date of birth, ID band Patient awake    Reviewed: Allergy & Precautions, H&P , NPO status   History of Anesthesia Complications (+) PONV and history of anesthetic complications  Airway Mallampati: II TM Distance: >3 FB Neck ROM: Full    Dental no notable dental hx. (+) Teeth Intact   Pulmonary Current Smoker,  .5 ppd breath sounds clear to auscultation  Pulmonary exam normal       Cardiovascular negative cardio ROS  Rhythm:Regular Rate:Normal     Neuro/Psych PSYCHIATRIC DISORDERS Anxiety Depression  Neuromuscular disease    GI/Hepatic negative GI ROS, Neg liver ROS,   Endo/Other  Hypothyroidism   Renal/GU negative Renal ROS     Musculoskeletal  (+) Arthritis -, Osteoarthritis,    Abdominal Normal abdominal exam  (+)   Peds  Hematology negative hematology ROS (+)   Anesthesia Other Findings   Reproductive/Obstetrics                         Anesthesia Physical Anesthesia Plan  ASA: II  Anesthesia Plan: General and Regional   Post-op Pain Management: MAC Combined w/ Regional for Post-op pain   Induction: Intravenous  Airway Management Planned: Oral ETT  Additional Equipment:   Intra-op Plan:   Post-operative Plan: Extubation in OR  Informed Consent: I have reviewed the patients History and Physical, chart, labs and discussed the procedure including the risks, benefits and alternatives for the proposed anesthesia with the patient or authorized representative who has indicated his/her understanding and acceptance.   Dental advisory given  Plan Discussed with: CRNA, Anesthesiologist and Surgeon  Anesthesia Plan Comments: (Discussed risks of adductor canal block including failure, bleeding, infection, nerve damage.  Adductor canal block does not usually prevent all pain. Specifically, it treats the anterior, but often not the  posterior knee. Questions answered.  Patient consents to block.)      Anesthesia Quick Evaluation

## 2014-09-20 NOTE — Transfer of Care (Signed)
Immediate Anesthesia Transfer of Care Note  Patient: Shelley Sherman  Procedure(s) Performed: Procedure(s): LEFT TOTAL KNEE ARTHROPLASTY (Left)  Patient Location: PACU  Anesthesia Type:General and Regional  Level of Consciousness: awake, oriented and patient cooperative  Airway & Oxygen Therapy: Patient Spontanous Breathing and Patient connected to face mask oxygen  Post-op Assessment: Report given to PACU RN, Post -op Vital signs reviewed and stable and Patient moving all extremities  Post vital signs: Reviewed and stable  Complications: No apparent anesthesia complications

## 2014-09-20 NOTE — Progress Notes (Signed)
Utilization review completed.  

## 2014-09-20 NOTE — Evaluation (Signed)
Physical Therapy Evaluation Patient Details Name: Shelley Sherman MRN: [id number] DOB: May 02, 1941 Today's Date: 09/20/2014   History of Present Illness  Pt is s/p L TKA.   Clinical Impression  Pt is s/p TKA resulting in the deficits listed below (see PT Problem List). Pt will benefit from skilled PT to increase their independence and safety with mobility to allow discharge to home with family to assist.  Anticipate pt will progress well with mobility.    Follow Up Recommendations Home health PT    Equipment Recommendations       Recommendations for Other Services       Precautions / Restrictions Precautions Precautions: Knee Required Braces or Orthoses: Knee Immobilizer - Left Knee Immobilizer - Left: On when out of bed or walking Restrictions Weight Bearing Restrictions: Yes LLE Weight Bearing: Weight bearing as tolerated      Mobility  Bed Mobility Overal bed mobility: Needs Assistance Bed Mobility: Supine to Sit     Supine to sit: Mod assist     General bed mobility comments: Needs physical assist with LLE  Transfers Overall transfer level:  (NT-pt too groggy to attempt OOB)                  Ambulation/Gait                Stairs            Wheelchair Mobility    Modified Rankin (Stroke Patients Only)       Balance Overall balance assessment:  (Pt sat EOB for about 10 minutes with no loss of balance)                                           Pertinent Vitals/Pain Pain Assessment: No/denies pain    Home Living Family/patient expects to be discharged to:: Private residence Living Arrangements: Spouse/significant other Available Help at Discharge: Family;Available 24 hours/day (Son to help at DC) Type of Home: House Home Access: Stairs to enter Entrance Stairs-Rails: Psychiatric nurse of Steps: 4 Home Layout: One level Home Equipment: Environmental consultant - 2 wheels;Cane - single point      Prior  Function Level of Independence: Independent with assistive device(s)         Comments: Used cane due to knee pain     Hand Dominance        Extremity/Trunk Assessment   Upper Extremity Assessment: Overall WFL for tasks assessed           Lower Extremity Assessment: LLE deficits/detail   LLE Deficits / Details: Decreased ROM/strength due to TKA today  Cervical / Trunk Assessment: Normal  Communication   Communication: No difficulties  Cognition Arousal/Alertness: Lethargic;Suspect due to medications Behavior During Therapy: [facility] for tasks assessed/performed Overall Cognitive Status: Within Functional Limits for tasks assessed                      General Comments General comments (skin integrity, edema, etc.): No family present. Pt with difficulty keeping eyes open during session    Exercises Total Joint Exercises Ankle Circles/Pumps: AROM;Both;10 reps;Seated      Assessment/Plan    PT Assessment Patient needs continued PT services  PT Diagnosis Difficulty walking   PT Problem List Decreased strength;Decreased range of motion;Decreased activity tolerance;Decreased knowledge of use of DME  PT Treatment Interventions DME instruction;Gait training;Stair training;Therapeutic exercise;Therapeutic activities;Patient/family  education   PT Goals (Current goals can be found in the Care Plan section) Acute Rehab PT Goals Patient Stated Goal: To play squash again PT Goal Formulation: With patient Time For Goal Achievement: 09/24/14 Potential to Achieve Goals: Good    Frequency 7X/week   Barriers to discharge        Co-evaluation               End of Session Equipment Utilized During Treatment: Oxygen Activity Tolerance: Patient limited by lethargy Patient left: in bed;with call bell/phone within reach Nurse Communication: Mobility status         Time: 2505-3976 PT Time Calculation (min): 25 min   Charges:   PT Evaluation $Initial PT  Evaluation Tier I: 1 Procedure PT Treatments $Therapeutic Activity: 8-22 mins   PT G Codes:          Melvern Banker 09/20/2014, 3:48 PM Lavonia Dana, Raymond 09/20/2014

## 2014-09-20 NOTE — Plan of Care (Signed)
Problem: Consults Goal: Diagnosis- Total Joint Replacement Primary Total Knee Left     

## 2014-09-20 NOTE — Anesthesia Postprocedure Evaluation (Signed)
  Anesthesia Post-op Note  Patient: Shelley Sherman  Procedure(s) Performed: Procedure(s) (LRB): LEFT TOTAL KNEE ARTHROPLASTY (Left)  Patient Location: PACU  Anesthesia Type: GA combined with regional for post-op pain  Level of Consciousness: awake and alert   Airway and Oxygen Therapy: Patient Spontanous Breathing  Post-op Pain: mild  Post-op Assessment: Post-op Vital signs reviewed, Patient's Cardiovascular Status Stable, Respiratory Function Stable, Patent Airway and No signs of Nausea or vomiting  Last Vitals:  Filed Vitals:   09/20/14 0945  BP: 124/66  Pulse: 83  Temp:   Resp: 18    Post-op Vital Signs: stable   Complications: No apparent anesthesia complications

## 2014-09-20 NOTE — Anesthesia Procedure Notes (Addendum)
Anesthesia Regional Block:  Adductor canal block  Pre-Anesthetic Checklist: ,, timeout performed, Correct Patient, Correct Site, Correct Laterality, Correct Procedure, Correct Position, site marked, Risks and benefits discussed,  Surgical consent,  Pre-op evaluation,  At surgeon's request and post-op pain management  Laterality: Lower and Left  Prep: chloraprep       Needles:  Injection technique: Single-shot  Needle Type: Stimulator Needle - 80      Needle Gauge: 21 and 21 G    Additional Needles:  Procedures: ultrasound guided (picture in chart) Adductor canal block Narrative:  Start time: 09/20/2014 7:00 AM  Performed by: Personally  Anesthesiologist: Denenny  Additional Notes: No pain on injection. No increased resistance to injection. Motor intact immediately after block. Meaningful verbal contact maintained with patient during procedure.   Procedure Name: Intubation Date/Time: 09/20/2014 7:27 AM Performed by: Izora Gala Pre-anesthesia Checklist: Patient identified, Emergency Drugs available, Suction available and Patient being monitored Patient Re-evaluated:Patient Re-evaluated prior to inductionOxygen Delivery Method: Circle system utilized Preoxygenation: Pre-oxygenation with 100% oxygen Intubation Type: IV induction Ventilation: Mask ventilation without difficulty and Oral airway inserted - appropriate to patient size Laryngoscope size: Glidescope used after [employer] 3 and Mac 4. Grade View: Grade III Tube type: Oral Tube size: 6.5 mm Number of attempts: 3 Airway Equipment and Method: Stylet and Oral airway Placement Confirmation: ETT inserted through vocal cords under direct vision,  positive ETCO2 and breath sounds checked- equal and bilateral Secured at: 22 cm Tube secured with: Tape Dental Injury: Teeth and Oropharynx as per pre-operative assessment  Difficulty Due To: Difficulty was unanticipated, Difficult Airway- due to anterior larynx and Difficult  Airway- due to dentition Future Recommendations: Recommend- induction with short-acting agent, and alternative techniques readily available Comments: DL and Intubation attempted by self with Sabra Heck 3 and Mac 4 by Dr. Eliseo Squires.  Glidescope used with class 1 view

## 2014-09-20 NOTE — Progress Notes (Signed)
Orthopedic Tech Progress Note Patient Details:  Shelley Sherman [birth year] [id number]  Patient ID: Shelley Sherman, female   DOB: [birth year], 73 y.o.   MRN: [id number] Placed pt's lle in cpm @ 0-60 degees @ [address] Ura Hausen 09/20/2014, 8:01 PM

## 2014-09-21 ENCOUNTER — Encounter (HOSPITAL_COMMUNITY): Payer: Self-pay | Admitting: Orthopedic Surgery

## 2014-09-21 DIAGNOSIS — M1712 Unilateral primary osteoarthritis, left knee: Secondary | ICD-10-CM | POA: Diagnosis not present

## 2014-09-21 LAB — BASIC METABOLIC PANEL
ANION GAP: 11 (ref 5–15)
BUN: 12 mg/dL (ref 6–23)
CO2: 28 mEq/L (ref 19–32)
Calcium: 8.8 mg/dL (ref 8.4–10.5)
Chloride: 102 mEq/L (ref 96–112)
Creatinine, Ser: 0.64 mg/dL (ref 0.50–1.10)
GFR calc Af Amer: >90 mL/min (ref >90)
GFR calc non Af Amer: 86 mL/min — ABNORMAL LOW (ref >90)
Glucose, Bld: 155 mg/dL — ABNORMAL HIGH (ref 70–99)
Potassium: 4.4 mEq/L (ref 3.7–5.3)
Sodium: 141 mEq/L (ref 137–147)

## 2014-09-21 LAB — CBC
HEMATOCRIT: 30.2 % — AB (ref 36.0–46.0)
Hemoglobin: 9.8 g/dL — ABNORMAL LOW (ref 12.0–15.0)
MCH: 30 pg (ref 26.0–34.0)
MCHC: 32.5 g/dL (ref 30.0–36.0)
MCV: 92.4 fL (ref 78.0–100.0)
Platelets: 211 10*3/uL (ref 150–400)
RBC: 3.27 MIL/uL — ABNORMAL LOW (ref 3.87–5.11)
RDW: 12.8 % (ref 11.5–15.5)
WBC: 15.9 10*3/uL — ABNORMAL HIGH (ref 4.0–10.5)

## 2014-09-21 MED ORDER — KETOROLAC TROMETHAMINE 15 MG/ML IJ SOLN
INTRAMUSCULAR | Status: AC
  Administered 2014-09-21: 7.5 mg
  Filled 2014-09-21: qty 1

## 2014-09-21 MED ORDER — HYDROMORPHONE HCL 2 MG PO TABS
2.0000 mg | ORAL_TABLET | ORAL | Status: DC | PRN
  Administered 2014-09-21 (×2): 2 mg via ORAL
  Filled 2014-09-21 (×2): qty 1

## 2014-09-21 MED ORDER — POLYETHYLENE GLYCOL 3350 17 G PO PACK: 17.0000 g | PACK | Freq: Two times a day (BID) | ORAL | Status: DC

## 2014-09-21 MED ORDER — HYDROMORPHONE HCL 2 MG PO TABS: ORAL_TABLET | ORAL | Status: DC

## 2014-09-21 MED ORDER — DSS 100 MG PO CAPS: 100.0000 mg | ORAL_CAPSULE | Freq: Two times a day (BID) | ORAL | Status: DC

## 2014-09-21 MED ORDER — ENOXAPARIN SODIUM 30 MG/0.3ML ~~LOC~~ SOLN: 30.0000 mg | Freq: Two times a day (BID) | SUBCUTANEOUS | Status: DC

## 2014-09-21 NOTE — Discharge Summary (Signed)
Patient ID: Shelley Sherman MRN: [id number] DOB/AGE: 1941-12-17 73 y.o.  Admit date: 09/20/2014 Discharge date: 09/21/2014  Admission Diagnoses:  Principal Problem:   Primary localized osteoarthritis of left knee Active Problems:   Syncope and collapse   Internal hemorrhoid   Hypercholesteremia   Hypothyroidism   Depression   DJD (degenerative joint disease) of knee   Discharge Diagnoses:  Same  Past Medical History  Diagnosis Date  . Hypercholesteremia     takes Lovastatin daily  . Primary localized osteoarthritis of left knee   . Depression 09/08/2014    takes Effexor daily  . Hypothyroidism     takes Synthroid daily  . Anxiety     takes Xanax nightly  . History of migraine     last one has been over a yr ago  . Dizziness     in May 2015  . Peripheral neuropathy   . Joint pain   . History of colon polyps   . Urinary urgency   . Cataracts, bilateral   . Macular degeneration, dry   . PONV (postoperative nausea and vomiting)     slow to wake up    Surgeries: Procedure(s): LEFT TOTAL KNEE ARTHROPLASTY on 09/20/2014   Consultants:    Discharged Condition: Improved  Hospital Course: AISHAH TEFFETELLER is an 73 y.o. female who was admitted 09/20/2014 for operative treatment ofPrimary localized osteoarthritis of left knee. Patient has severe unremitting pain that affects sleep, daily activities, and work/hobbies. After pre-op clearance the patient was taken to the operating room on 09/20/2014 and underwent  Procedure(s): LEFT TOTAL KNEE ARTHROPLASTY.    Patient was given perioperative antibiotics: Anti-infectives   Start     Dose/Rate Route Frequency Ordered Stop   09/20/14 1300  ceFAZolin (ANCEF) IVPB 1 g/50 mL premix     1 g 100 mL/hr over 30 Minutes Intravenous Every 6 hours 09/20/14 1107 09/20/14 2024       Patient was given sequential compression devices, early ambulation, and chemoprophylaxis to prevent DVT.  Patient benefited maximally from hospital  stay and there were no complications.    Recent vital signs: Patient Vitals for the past 24 hrs:  BP Temp Temp src Pulse Resp SpO2  09/21/14 0527 104/53 mmHg 98.4 F (36.9 C) Oral 77 17 94 %  09/21/14 0400 - - - - 16 95 %  09/21/14 0024 99/56 mmHg 98.1 F (36.7 C) Oral 73 16 96 %  09/21/14 0000 - - - - 16 96 %  09/20/14 2111 107/60 mmHg 97.5 F (36.4 C) Oral 65 17 96 %  09/20/14 2000 - - - - 17 94 %  09/20/14 1700 105/64 mmHg 97.7 F (36.5 C) - 64 16 97 %  09/20/14 1115 - 98.7 F (37.1 C) - - - -  09/20/14 1100 94/53 mmHg - - 63 - 99 %  09/20/14 1045 104/66 mmHg 98.1 F (36.7 C) - 75 22 94 %  09/20/14 1030 102/64 mmHg - - 71 13 99 %  09/20/14 1015 100/57 mmHg - - 77 10 99 %  09/20/14 1000 100/62 mmHg - - 77 14 99 %  09/20/14 0945 124/66 mmHg - - 83 18 100 %  09/20/14 0930 137/67 mmHg 97.4 F (36.3 C) - 95 14 91 %     Recent laboratory studies:  Recent Labs  09/20/14 1150 09/21/14 0600  WBC 11.4* 15.9*  HGB 11.6* 9.8*  HCT 35.1* 30.2*  PLT 225 211  NA  --  141  K  --  4.4  CL  --  102  CO2  --  28  BUN  --  12  CREATININE 0.72 0.64  GLUCOSE  --  155*  CALCIUM  --  8.8     Discharge Medications:     Medication List         ALPRAZolam 0.25 MG tablet  Commonly known as:  XANAX  Take 0.25 mg by mouth at bedtime as needed for sleep.     b complex vitamins tablet  Take 1 tablet by mouth daily.     beta carotene w/minerals tablet  Take 1 tablet by mouth daily.     calcium carbonate 1250 MG capsule  Take 1,250 mg by mouth daily.     DSS 100 MG Caps  Take 100 mg by mouth 2 (two) times daily.     enoxaparin 30 MG/0.3ML injection  Commonly known as:  LOVENOX  Inject 0.3 mLs (30 mg total) into the skin every 12 (twelve) hours.     HYDROmorphone 2 MG tablet  Commonly known as:  DILAUDID  1-2 tablets every 4-6 hrs as needed for pain     hydrOXYzine 25 MG tablet  Commonly known as:  ATARAX/VISTARIL  Take 25 mg by mouth 3 (three) times daily as needed for  anxiety.     levothyroxine 75 MCG tablet  Commonly known as:  SYNTHROID, LEVOTHROID  Take 75 mcg by mouth daily before breakfast.     lovastatin 40 MG tablet  Commonly known as:  MEVACOR  Take 40 mg by mouth at bedtime.     Magnesium 250 MG Tabs  Take 250 mg by mouth daily.     polyethylene glycol packet  Commonly known as:  MIRALAX / GLYCOLAX  Take 17 g by mouth 2 (two) times daily.     venlafaxine XR 75 MG 24 hr capsule  Commonly known as:  EFFEXOR-XR  Take 75 mg by mouth daily with breakfast.        Diagnostic Studies: Dg Chest 2 View  09/10/2014   CLINICAL DATA:  Preoperative evaluation for knee replacement  EXAM: CHEST  2 VIEW  COMPARISON:  None.  FINDINGS: The heart size and mediastinal contours are within normal limits. Both lungs are clear. The visualized skeletal structures are unremarkable.  IMPRESSION: No active cardiopulmonary disease.   Electronically Signed   By: Inez Catalina M.D.   On: 09/10/2014 12:06    Disposition: Final discharge disposition not confirmed      Discharge Instructions   CPM    Complete by:  As directed   Continuous passive motion machine (CPM):      Use the CPM from 0 to 90 for 6 hours per day.       You may break it up into 2 or 3 sessions per day.      Use CPM for 2 weeks or until you are told to stop.     Call MD / Call 911    Complete by:  As directed   If you experience chest pain or shortness of breath, CALL 911 and be transported to the hospital emergency room.  If you develope a fever above 101 F, pus (white drainage) or increased drainage or redness at the wound, or calf pain, call your surgeon's office.     Change dressing    Complete by:  As directed   DO NOT REMOVE BANDAGE OVER SURGICAL INCISION.  [place] WHOLE LEG INCLUDING OVER THE WATERPROOF BANDAGE WITH SOAP AND WATER  EVERY DAY.     Constipation Prevention    Complete by:  As directed   Drink plenty of fluids.  Prune juice may be helpful.  You may use a stool softener, such  as Colace (over the counter) 100 mg twice a day.  Use MiraLax (over the counter) for constipation as needed.     Diet - low sodium heart healthy    Complete by:  As directed      Discharge instructions    Complete by:  As directed   Total Knee Replacement Care After Refer to this sheet in the next few weeks. These discharge instructions provide you with general information on caring for yourself after you leave the hospital. Your caregiver may also give you specific instructions. Your treatment has been planned according to the most current medical practices available, but unavoidable complications sometimes occur. If you have any problems or questions after discharge, please call your caregiver. Regaining a near full range of motion of your knee within the first 3 to 6 weeks after surgery is critical. [place] may resume a normal diet and activities as directed.  Perform exercises as directed.  Place gray foam block, curve side up under heel at all times except when in CPM or when walking.  DO NOT modify, tear, cut, or change in any way the gray foam block. You will receive physical therapy daily  Take showers instead of baths until informed otherwise.  You may shower on Sunday.  Please wash whole leg including wound with soap and water  DO NOT REMOVE BANDAGE OVER SURGICAL INCISION.  OK TO SHOWER AND [place] WHOLE LEG WITH SOAP AND WATER It is OK to take over-the-counter tylenol in addition to the oxycodone for pain, discomfort, or fever. Oxycodone is VERY constipating.  Please take stool softener twice a day and laxatives daily until bowels are regular Eat a well-balanced diet.  Avoid lifting or driving until you are instructed otherwise.  Make an appointment to see your caregiver for stitches (suture) or staple removal as directed.  If you have been sent home with a continuous passive motion machine (CPM machine), 0-90 degrees 6 hrs a day   2 hrs a shift SEEK MEDICAL CARE  IF: You have swelling of your calf or leg.  You develop shortness of breath or chest pain.  You have redness, swelling, or increasing pain in the wound.  There is pus or any unusual drainage coming from the surgical site.  You notice a bad smell coming from the surgical site or dressing.  The surgical site breaks open after sutures or staples have been removed.  There is persistent bleeding from the suture or staple line.  You are getting worse or are not improving.  You have any other questions or concerns.  SEEK IMMEDIATE MEDICAL CARE IF:  You have a fever.  You develop a rash.  You have difficulty breathing.  You develop any reaction or side effects to medicines given.  Your knee motion is decreasing rather than improving.  MAKE SURE YOU:  Understand these instructions.  Will watch your condition.  Will get help right away if you are not doing well or get worse.     Do not put a pillow under the knee. Place it under the heel.    Complete by:  As directed   Place gray foam block, curve side up under heel at all times except when in CPM or when walking.  DO NOT  modify, tear, cut, or change in any way the gray foam block.     Increase activity slowly as tolerated    Complete by:  As directed      TED hose    Complete by:  As directed   Use stockings (TED hose) for 2 weeks on both leg(s).  You may remove them at night for sleeping.           Follow-up Information   Follow up with Lorn Junes, MD On 10/04/2014. (APPT TIME 2:15 PM)    Specialty:  Orthopedic Surgery   Contact information:   [place] [address] [phone]        Signed: Linda Hedges 09/21/2014, 7:59 AM

## 2014-09-21 NOTE — Evaluation (Signed)
Occupational Therapy Evaluation Patient Details Name: Shelley Sherman MRN: [id number] DOB: Dec 31, 1940 Today's Date: 09/21/2014    History of Present Illness Pt is s/p L TKA.    Clinical Impression   Pt admitted as above, presents at overall supervision level for functional mobility and transfers related to ADL/self care tasks. She has DME & A/E & will have family assist PRN at d/c. No further acute OT needs at this time & plans to d/c home later today.     Follow Up Recommendations  No OT follow up    Equipment Recommendations  None recommended by OT;Other (comment) (Pt has DME and A/E at home, she reports no needs at this time.)    Recommendations for Other Services       Precautions / Restrictions Precautions Precautions: Knee Precaution Comments: pt able to perform SLR this am.  Did not use KI.   Restrictions Weight Bearing Restrictions: Yes LLE Weight Bearing: Weight bearing as tolerated      Mobility Bed Mobility               General bed mobility comments: pt sitting in recliner.    Transfers Overall transfer level: Needs assistance Equipment used: Rolling walker (2 wheeled) Transfers: Sit to/from Stand Sit to Stand: Supervision         General transfer comment: pt demos good technique.      Balance Overall balance assessment: No apparent balance deficits (not formally assessed)                                          ADL Overall ADL's : Needs assistance/impaired     Grooming: Wash/dry hands;Wash/dry face;Oral care;Supervision/safety;Standing   Upper Body Bathing: Supervision/ safety;Sitting;Standing   Lower Body Bathing: Supervison/ safety;Sit to/from stand   Upper Body Dressing : Modified independent;Sitting   Lower Body Dressing: Supervision/safety;Sit to/from stand   Toilet Transfer: Supervision/safety;Comfort height toilet;Ambulation;RW;Grab bars (Pt has 3:1 at home)   Toileting- Water quality scientist and  Hygiene: Supervision/safety;Sit to/from stand   Tub/ Shower Transfer: Supervision/safety;Ambulation;Grab bars;Rolling walker Tub/Shower Transfer Details (indicate cue type and reason):  (Simulated side stepping into shower using sm step & RW. Pt has RW, 3:1, shower chair and grab bars, LH reacher as well as hand held shower at home. Discussed use of all.) Functional mobility during ADLs: Supervision/safety;Rolling walker General ADL Comments: Pt was educated in role of OT. Pt performed side stepping into bathroom and demonstrated supervision level toilet transfers. She was educated in use of A/E PRN, use of 3:1 over toilet at home PRN for safety w/ transfers. Pt has DME and A/E, grab bars in walk in shower and hand held shower head, Simulated shower transfers were performed and pt then completed grooming tasks standing at sink w/ supervision. Pt then completed UB/LB dressing sitting in chair and w/ sit-stand transfers - supervision level. No further OT recommended at this time.     Vision  Wears glasses at all times, no change from baseline.                   Perception     Praxis      Pertinent Vitals/Pain Pain Assessment: 0-10 Pain Score: 4  Pain Location:  (L knee) Pain Descriptors / Indicators: Sore Pain Intervention(s): Premedicated before session;Repositioned     Hand Dominance     Extremity/Trunk Assessment Upper Extremity Assessment Upper Extremity Assessment:  Overall [facility] for tasks assessed   Lower Extremity Assessment Lower Extremity Assessment: Defer to PT evaluation   Cervical / Trunk Assessment Cervical / Trunk Assessment: Normal   Communication Communication Communication: No difficulties   Cognition Arousal/Alertness: Awake/alert Behavior During Therapy: WFL for tasks assessed/performed Overall Cognitive Status: Within Functional Limits for tasks assessed                     General Comments       Exercises Exercises: Total Joint      Shoulder Instructions      Home Living Family/patient expects to be discharged to:: Private residence Living Arrangements: Spouse/significant other Available Help at Discharge: Family;Available 24 hours/day (Pt's son to help at d/c) Type of Home: House Home Access: Stairs to enter [employer] of Steps: 4 Entrance Stairs-Rails: Right;Left Home Layout: One level     Bathroom Shower/Tub: Walk-in shower;Door   [employer] Toilet: Handicapped height Bathroom Accessibility: Yes How Accessible: Accessible via walker (Pt reports narrow doors, therefore practiced side stepping) Home Equipment: Walker - 2 wheels;Cane - single point;Bedside commode;Grab bars - tub/shower;Hand held shower head;Other (comment) (LH Reacher)          Prior Functioning/Environment Level of Independence: Independent with assistive device(s)        Comments: Used cane at times due to knee pain    OT Diagnosis:     OT Problem List:     OT Treatment/Interventions:      OT Goals(Current goals can be found in the care plan section) Acute Rehab OT Goals Patient Stated Goal: To go home later today  OT Frequency:     Barriers to D/C:            Co-evaluation              End of Session Equipment Utilized During Treatment: Gait belt;Rolling walker CPM Left Knee CPM Left Knee: On Left Knee Flexion (Degrees): 45 Left Knee Extension (Degrees): 0 Nurse Communication: Mobility status;Other (comment) (Pt completed ADL's (toileting/bathing & dressing))  Activity Tolerance: Patient tolerated treatment well Patient left: in chair;with call bell/phone within reach;with nursing/sitter in room;with family/visitor present   Time: 8099-8338 OT Time Calculation (min): 28 min Charges:  OT General Charges $OT Visit: 1 Procedure OT Evaluation $Initial OT Evaluation Tier I: 1 Procedure OT Treatments $Self Care/Home Management : 8-22 mins G-Codes:    Josephine Igo Dixon 09/21/2014, 8:50  AM

## 2014-09-21 NOTE — Progress Notes (Signed)
Physical Therapy Treatment Patient Details Name: Shelley Sherman MRN: [id number] DOB: December 20, 1940 Today's Date: 09/21/2014    History of Present Illness Pt is s/p L TKA.     PT Comments    Pt moving great and anticipate will continue to make great progress.  At end of session pt left with OT.  Pt is ready for D/C from PT stand point.    Follow Up Recommendations  Home health PT;Supervision - Intermittent     Equipment Recommendations  None recommended by PT    Recommendations for Other Services       Precautions / Restrictions Precautions Precautions: Knee Precaution Comments: pt able to perform SLR this am.  Did not use KI.   Restrictions Weight Bearing Restrictions: Yes LLE Weight Bearing: Weight bearing as tolerated    Mobility  Bed Mobility               General bed mobility comments: pt sitting in recliner.    Transfers Overall transfer level: Needs assistance Equipment used: Rolling walker (2 wheeled) Transfers: Sit to/from Stand Sit to Stand: Supervision         General transfer comment: pt demos good technique.    Ambulation/Gait Ambulation/Gait assistance: Supervision Ambulation Distance (Feet): 200 Feet Assistive device: Rolling walker (2 wheeled) Gait Pattern/deviations: Step-through pattern;Decreased stride length     General Gait Details: pt moving great and demos good step-through pattern.     Stairs Stairs: Yes Stairs assistance: Min guard Stair Management: One rail Right;Step to pattern;Forwards Number of Stairs: 2 General stair comments: pt demos good safety on stairs and good recall of technique.    Wheelchair Mobility    Modified Rankin (Stroke Patients Only)       Balance Overall balance assessment: No apparent balance deficits (not formally assessed)                                  Cognition Arousal/Alertness: Awake/alert Behavior During Therapy: WFL for tasks assessed/performed Overall Cognitive  Status: Within Functional Limits for tasks assessed                      Exercises Total Joint Exercises Ankle Circles/Pumps: AROM;Both;10 reps;Seated Quad Sets: AROM;Both;10 reps Straight Leg Raises: AROM;Left;10 reps Long Arc Quad: AROM;Left;10 reps Knee Flexion: AROM;Left;10 reps Goniometric ROM: ~10 - 70    General Comments        Pertinent Vitals/Pain Pain Assessment: 0-10 Pain Score: 4  Pain Location: L knee Pain Descriptors / Indicators: Sore Pain Intervention(s): Repositioned;Premedicated before session    Home Living Family/patient expects to be discharged to:: Private residence Living Arrangements: Spouse/significant other Available Help at Discharge: Family;Available 24 hours/day (Pt's son to help at d/c) Type of Home: House Home Access: Stairs to enter Entrance Stairs-Rails: Right;Left Home Layout: One level Home Equipment: Environmental consultant - 2 wheels;Cane - single point      Prior Function Level of Independence: Independent with assistive device(s)      Comments: Used cane at times due to knee pain   PT Goals (current goals can now be found in the care plan section) Acute Rehab PT Goals Patient Stated Goal: To play squash again PT Goal Formulation: With patient Time For Goal Achievement: 09/24/14 Potential to Achieve Goals: Good Progress towards PT goals: Progressing toward goals    Frequency  7X/week    PT Plan Current plan remains appropriate    Co-evaluation  End of Session Equipment Utilized During Treatment: Gait belt Activity Tolerance: Patient tolerated treatment well Patient left: in chair (with OT)     Time: 9741-6384 PT Time Calculation (min): 20 min  Charges:  $Gait Training: 8-22 mins                    G CodesCatarina Hartshorn, [place] 09/21/2014, 8:31 AM

## 2014-09-22 NOTE — Care Management Note (Signed)
CARE MANAGEMENT NOTE 09/22/2014  Patient:  Shelley Sherman, Shelley Sherman   Account Number:  [id number]  Date Initiated:  09/22/2014  Documentation initiated by:  Ricki Miller  Subjective/Objective Assessment:   73 yr old female admitted with DJD of left knee. Patient underwent Left total knee arthroplasty.     Action/Plan:   Patient was preoperatively setup with [facility], no changes. Patient has rolling walker , 3in1. Has  family support at discharge.   Anticipated DC Date:  09/21/2014   Anticipated DC Plan:  [place]  CM consult      [facility] Choice  HOME HEALTH   Choice offered to / List presented to:  C-1 Patient   DME arranged  CPM      DME agency  [employer]     [place] arranged  HH-2 PT      [place]   Status of service:  Completed, signed off Medicare Important Message given?  NA - LOS <3 / Initial given by admissions (If response is "NO", the following Medicare IM given date fields will be blank) Date Medicare IM given:   Medicare IM given by:   Date Additional Medicare IM given:   Additional Medicare IM given by:    Discharge Disposition:  [place]  Per UR Regulation:  Reviewed for med. necessity/level of care/duration of stay

## 2015-04-09 NOTE — H&P (Signed)
PATIENT NAME:  Shelley Sherman, Shelley Sherman MR#:  [id number] DATE OF BIRTH:  1941-07-19  DATE OF ADMISSION:  04/24/2014  REFERRING PHYSICIAN: Dr. Dahlia Client.  PRIMARY CARE PHYSICIAN: Dr. Lavera Guise   CHIEF COMPLAINT: Passing out.  HISTORY OF PRESENT ILLNESS: A 74 year old Caucasian female with history of hyperlipidemia, hypothyroidism, presenting after a syncopal episode. States that she was working outside for multiple hours, felt hot and diaphoretic and then subsequently passed out with loss of consciousness for a few minutes. Denies any head trauma. No postictal confusion. No loss of bowel or bladder function. She had no preceding symptoms, other than the diaphoresis. No chest pain, palpitations, or shortness of breath. After waking up on the floor, she attempted to stand up and had a repeat syncopal episode. Subsequently brought to the hospital for further workup and evaluation. On initial arrival, noted to be hypotensive with initial blood pressure of 76/42, which responded appropriately to IV fluid hydration. Currently without complaints.  REVIEW OF SYSTEMS:  CONSTITUTIONAL: Denies fever, fatigue, positive for generalized weakness.  EYES: Denies blurry, double vision, or eye pain.  HEENT: Denies tinnitus, ear pain, hearing loss.  RESPIRATORY: Denies cough, wheeze, shortness of breath. CARDIOVASCULAR: Denies chest pain, palpitations, edema. GASTROINTESTINAL: Denies nausea, vomiting, diarrhea, abdominal pain.  GENITOURINARY: Denies dysuria or hematuria.  ENDOCRINE: Denies nocturia or thyroid problems.  HEMATOLOGIC AND LYMPHATIC: Denies easy bruising, bleeding. SKIN: Denies rash or lesions. MUSCULOSKELETAL: Denies pain in neck, back, shoulders, knees, or hips or arthritic symptoms.  NEUROLOGIC: Denies paralysis or paresthesias.  PSYCHIATRIC: Denies anxiety or depressive symptoms.   Otherwise, a full review of systems performed by me is negative.  PAST MEDICAL HISTORY: Hyperlipidemia as well as  hypothyroidism, anxiety and depression, not otherwise specified.   SOCIAL HISTORY: Positive for tobacco usage or tobacco abuse, occasional alcohol. No drug usage.   FAMILY HISTORY: Positive for coronary artery disease.   ALLERGIES: No known drug allergies.   HOME MEDICATIONS: Include: Venlafaxine 75 mg p.o. daily, lovastatin 40 mg p.o. daily, Isordil 10 mg p.o. at bedtime p.r.n. muscle spasm, Synthroid 75 mcg p.o. daily.   PHYSICAL EXAMINATION:  VITAL SIGNS: Temperature 97.5, heart rate 75, respirations 18, blood pressure 76/42, currently it is at 131/66, saturating 96% on room air. Weight 72.6 kg, BMI 25.8.  GENERAL: Well-nourished, well-developed, Caucasian appearing in no acute distress.  HEAD: Normocephalic, atraumatic.  EYES: Pupils equal, round, reactive to light. Extraocular muscles intact. No scleral icterus.  MOUTH: Moist mucous membranes.  Dentition poor. No abscess noted.  EARS, NOSE, AND THROAT: Clear. No exudates. No external lesions.  NECK: Supple. No thyromegaly. No nodules. No JVD.  PULMONARY: Clear to auscultation bilaterally without wheezes, rales, or rhonchi. No use of accessory muscles. Good respiratory effort.  CHEST:  Nontender on palpation.  CARDIOVASCULAR: S1, S2, regular rate and rhythm. No murmurs, rubs, or gallops. No edema. Pedal pulses 2+ bilaterally.  GASTROINTESTINAL: Soft, nontender, nondistended. No masses. Positive bowel sounds. No hepatosplenomegaly.  MUSCULOSKELETAL: No swelling, clubbing, or edema. Range of motion is full in all extremities. NEUROLOGIC: Cranial nerves II through XII intact. No gross neurologic deficits. Sensation intact. Reflexes intact.  SKIN: No ulcerations, lesions, no rashes, or cyanosis. Skin warm and dry. Turgor intact. PSYCHIATRIC: Mood and affect within normal limits. The patient is alert and oriented x 3. Insight and judgment intact.   LABORATORY DATA: CT head performed reveals no acute intracranial process. EKG performed  revealing normal sinus rhythm. No significant conduction abnormalities, no ST or T wave abnormalities.   The remainder  of laboratory data: Sodium 141, potassium 4.2, chloride 108, bicarbonate 27, BUN 17, creatinine 0.94, glucose 126, albumin 3.3, otherwise LFTs within normal limits. Troponin I 0.02. WBC 9.4, hemoglobin 11.1, platelets 213,000.  ASSESSMENT AND PLAN: This is a 74 year old Caucasian female with a history of hypothyroidism presenting after a syncopal episode. 1. Syncope. Admitted to telemetry under observational status, trend cardiac enzymes x 3 and IV fluid hydration. Check orthostatic vital signs though has already received IV fluid hydration.  2. Hyperlipidemia. Continue statin therapy.  3. Hypothyroidism. Continue with Synthroid.  4. Venous thromboembolism prophylaxis with heparin subcutaneous.  CODE STATUS: The patient is a full code.   TIME SPENT: 55 minutes.   ____________________________ Aaron Mose. Lakin Rhine, MD dkh:lt D: 04/24/2014 03:51:36 ET T: 04/24/2014 06:12:42 ET JOB#: [id number]  cc: Aaron Mose. Jaze Rodino, MD, <Dictator> Dayvin Aber Woodfin Ganja MD ELECTRONICALLY SIGNED 04/24/2014 20:49

## 2015-04-09 NOTE — Discharge Summary (Signed)
Dates of Admission and Diagnosis:  Date of Admission 24-Apr-2014   Date of Discharge 25-Apr-2014   Admitting Diagnosis syncopal episode   Final Diagnosis syncopal episodes  with dizziness. Viral URi+ possible labirinthitis- Dizziness, heaviness in Ear, Throat and Jaw. Hypotension on presentation- Dehydration Hypothyroidism    Chief Complaint/History of Present Illness HISTORY OF PRESENT ILLNESS: A 74 year old Caucasian female with history of hyperlipidemia, hypothyroidism, presenting after a syncopal episode. States that she was working outside for multiple hours, felt hot and diaphoretic and then subsequently passed out with loss of consciousness for a few minutes. Denies any head trauma. No postictal confusion. No loss of bowel or bladder function. She had no preceding symptoms, other than the diaphoresis. No chest pain, palpitations, or shortness of breath. After waking up on the floor, she attempted to stand up and had a repeat syncopal episode. Subsequently brought to the hospital for further workup and evaluation. On initial arrival, noted to be hypotensive with initial blood pressure of 76/42, which responded appropriately to IV fluid hydration. Currently without complaints.   Allergies:  Allergy Status Unknown:   Hepatic:  09-May-15 01:15   Bilirubin, Total 0.4  Alkaline Phosphatase 64 (45-117 NOTE: New Reference Range 11/06/13)  SGPT (ALT) 26  SGOT (AST) 34  Total Protein, Serum 6.6  Albumin, Serum  3.3  Routine Chem:  09-May-15 01:15   Glucose, Serum  126  BUN 17  Creatinine (comp) 0.94  Sodium, Serum 141  Potassium, Serum 4.2  Chloride, Serum  108  CO2, Serum 27  Calcium (Total), Serum 9.1  Osmolality (calc) 284  eGFR (African American) >60  eGFR (Non-African American)  60 (eGFR values <63m/min/1.73 m2 may be an indication of chronic kidney disease (CKD). Calculated eGFR is useful in patients with stable renal function. The eGFR calculation will not be reliable  in acutely ill patients when serum creatinine is changing rapidly. It is not useful in  patients on dialysis. The eGFR calculation may not be applicable to patients at the low and high extremes of body sizes, pregnant women, and vegetarians.)  Result Comment POTASSIUM/AST - Slight hemolysis, interpret results with  - caution.  Result(s) reported on 24 Apr 2014 at 02:03AM.  Anion Gap  6  Cardiac:  09-May-15 01:15   Troponin I < 0.02 (0.00-0.05 0.05 ng/mL or less: NEGATIVE  Repeat testing in 3-6 hrs  if clinically indicated. >0.05 ng/mL: POTENTIAL  MYOCARDIAL INJURY. Repeat  testing in 3-6 hrs if  clinically indicated. NOTE: An increase or decrease  of 30% or more on serial  testing suggests a  clinically important change)  Routine Hem:  09-May-15 01:15   WBC (CBC) 9.0  RBC (CBC)  3.71  Hemoglobin (CBC)  11.1  Hematocrit (CBC)  34.6  Platelet Count (CBC) 213 (Result(s) reported on 24 Apr 2014 at 02:21AM.)  MCV 93  MCH 29.9  MCHC 32.1  RDW 13.3   PERTINENT RADIOLOGY STUDIES: MRI:    09-May-15 16:43, MRI Brain Without Contrast  MRI Brain Without Contrast   REASON FOR EXAM:    Dizziness, Syncopal episode  COMMENTS:       PROCEDURE: MR  - MR BRAIN WO CONTRAST  - Apr 24 2014  4:43PM     CLINICAL DATA:  Dizziness and syncopal episode.    EXAM:  MRI HEAD WITHOUT CONTRAST    TECHNIQUE:  Multiplanar, multiecho pulse sequences of the brain and surrounding  structures were obtained without intravenous contrast.    COMPARISON:  CT head without contrast  from the same day.  FINDINGS:  An 8 mm benign appearing cystic lesion is present along the  posterior aspect of the pituitary. The posterior pituitary bright  spot is preserved. The pituitary stalk is midline.    No acute infarct, hemorrhage, or mass lesion is present. Mild  generalized atrophy and scattered white matter changes are seen  bilaterally. The ventricles are proportionate to the degree of  atrophy. White  matter changes extend into the brainstem.    Flow is present in the major intracranial arteries. The globes and  orbits are intact. The paranasal sinuses and mastoid air cells are  clear.     IMPRESSION:  1. No acute intracranial abnormality.  2. Age advanced atrophy and diffuse white matter disease. This  likely reflects the sequela of chronic microvascular ischemia.  3. 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      Electronically Signed    By: Lawrence Santiago M.D.    On: 04/24/2014 16:57         Verified By: Resa Miner. MATTERN, M.D.,  CT:    09-May-15 01:53, CT Head Without Contrast  CT Head Without Contrast   REASON FOR EXAM:    syncope  COMMENTS:       PROCEDURE: CT  - CT HEAD WITHOUT CONTRAST  - Apr 24 2014  1:53AM     CLINICAL DATA:  Syncope.    EXAM:  CT HEAD WITHOUT CONTRAST    TECHNIQUE:  Contiguous axial images were obtained from the base of the skull  through the vertex without intravenous contrast.    COMPARISON:  09/21/2008  FINDINGS:  Skull and Sinuses:No significant abnormality.    Orbits: No acute abnormality.    Brain: No evidence of acute abnormality, such as acute infarction,  hemorrhage, hydrocephalus, or mass lesion/mass effect. Generalized  cerebral volume loss, age appropriate. There is related ex vacuo  ventricular enlargement. Chronic small vessel disease with patchy  ischemic gliosis in the bilateral deep cerebral white matter.     IMPRESSION:  1. No acute intracranial disease.  2. 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  Electronically Signed    By: Jorje Guild M.D.    On: 04/24/2014 02:01         Verified By: Gilford Silvius, M.D.,   Pertinent Past History:  Pertinent Past History Hyperlipidemia, hypothyroidism, anxiety and depression   Hospital Course:  Hospital Course * Syncope.      Also had some heaviness in left ear and jaw.- may be  labirinthitis/ viral URI -  spoke to [facility] ENT doctor- as CT head does not saw any ear abnormality- it is unlikley bacterial ear infection, if it is labirinthitis- management is symptomatic.MRI is negative.   troponin negative.   Encouraged To get Echo in offce with Dr. Dellia Beckwith.    They checked TSH in office in last 2 mths as per her.   Discharge home with symptomatic management.  * Hyperlipidemia. Continued statin therapy.  * Hypothyroidism. Continued with Synthroid.   Condition on Discharge Stable   Code Status:  Code Status Full Code   DISCHARGE INSTRUCTIONS HOME MEDS:  Medication Reconciliation: Patient's Home Medications at Discharge:     Medication Instructions  venlafaxine 75 mg oral capsule, extended release  1 cap(s) orally once a day   levothyroxine 75 mcg (0.075 mg) oral capsule  1 cap(s) orally once a day   cyclobenzaprine 10 mg oral tablet  1 tab(s) orally once a day (at bedtime)   lovastatin 40 mg oral tablet  1 tab(s) orally once a day   meclizine 12.5 mg oral tablet  1 tab(s) orally every 8 hours x 4 days   cetirizine 10 mg oral tablet  1 tab(s) orally 2 times a day x 4 days, As Needed for congestion in throat   amoxicillin 500 mg oral capsule  1 cap(s) orally every 8 hours x 5 days     Physician's Instructions:  Diet Low Fat, Low Cholesterol   Activity Limitations As tolerated   Return to Work Not Applicable   Time frame for Follow Up Appointment 1-2 weeks  PMD   Other Comments suggest to get Echocardiogram in PMD's office. Suggest to folow with ENT doctor in office in 2-3 weeks.   Electronic Signatures: Vaughan Basta (MD)  (Signed 13-May-15 14:42)  Authored: ADMISSION DATE AND DIAGNOSIS, CHIEF COMPLAINT/HPI, Allergies, PERTINENT LABS, PERTINENT RADIOLOGY STUDIES, PERTINENT PAST HISTORY, HOSPITAL COURSE, DISCHARGE INSTRUCTIONS HOME MEDS, PATIENT INSTRUCTIONS   Last Updated: 13-May-15 14:42 by Vaughan Basta (MD)

## 2015-11-09 ENCOUNTER — Other Ambulatory Visit: Payer: Self-pay | Admitting: Internal Medicine

## 2015-11-09 DIAGNOSIS — Z1231 Encounter for screening mammogram for malignant neoplasm of breast: Secondary | ICD-10-CM

## 2015-11-22 ENCOUNTER — Ambulatory Visit: Payer: Self-pay

## 2015-11-23 ENCOUNTER — Ambulatory Visit
Admission: RE | Admit: 2015-11-23 | Discharge: 2015-11-23 | Disposition: A | Discharge status: Home / Self Care | Payer: PPO | Source: Ambulatory Visit | Attending: Internal Medicine | Admitting: Internal Medicine

## 2015-11-23 DIAGNOSIS — Z1231 Encounter for screening mammogram for malignant neoplasm of breast: Secondary | ICD-10-CM | POA: Insufficient documentation

## 2015-12-03 IMAGING — CR DG CHEST 2V
2 series · 2 of 2 positions shown · non-contrast
Comparison: None.

CLINICAL DATA: Preoperative evaluation for knee replacement

EXAM:
CHEST  2 VIEW

[w chest pa]
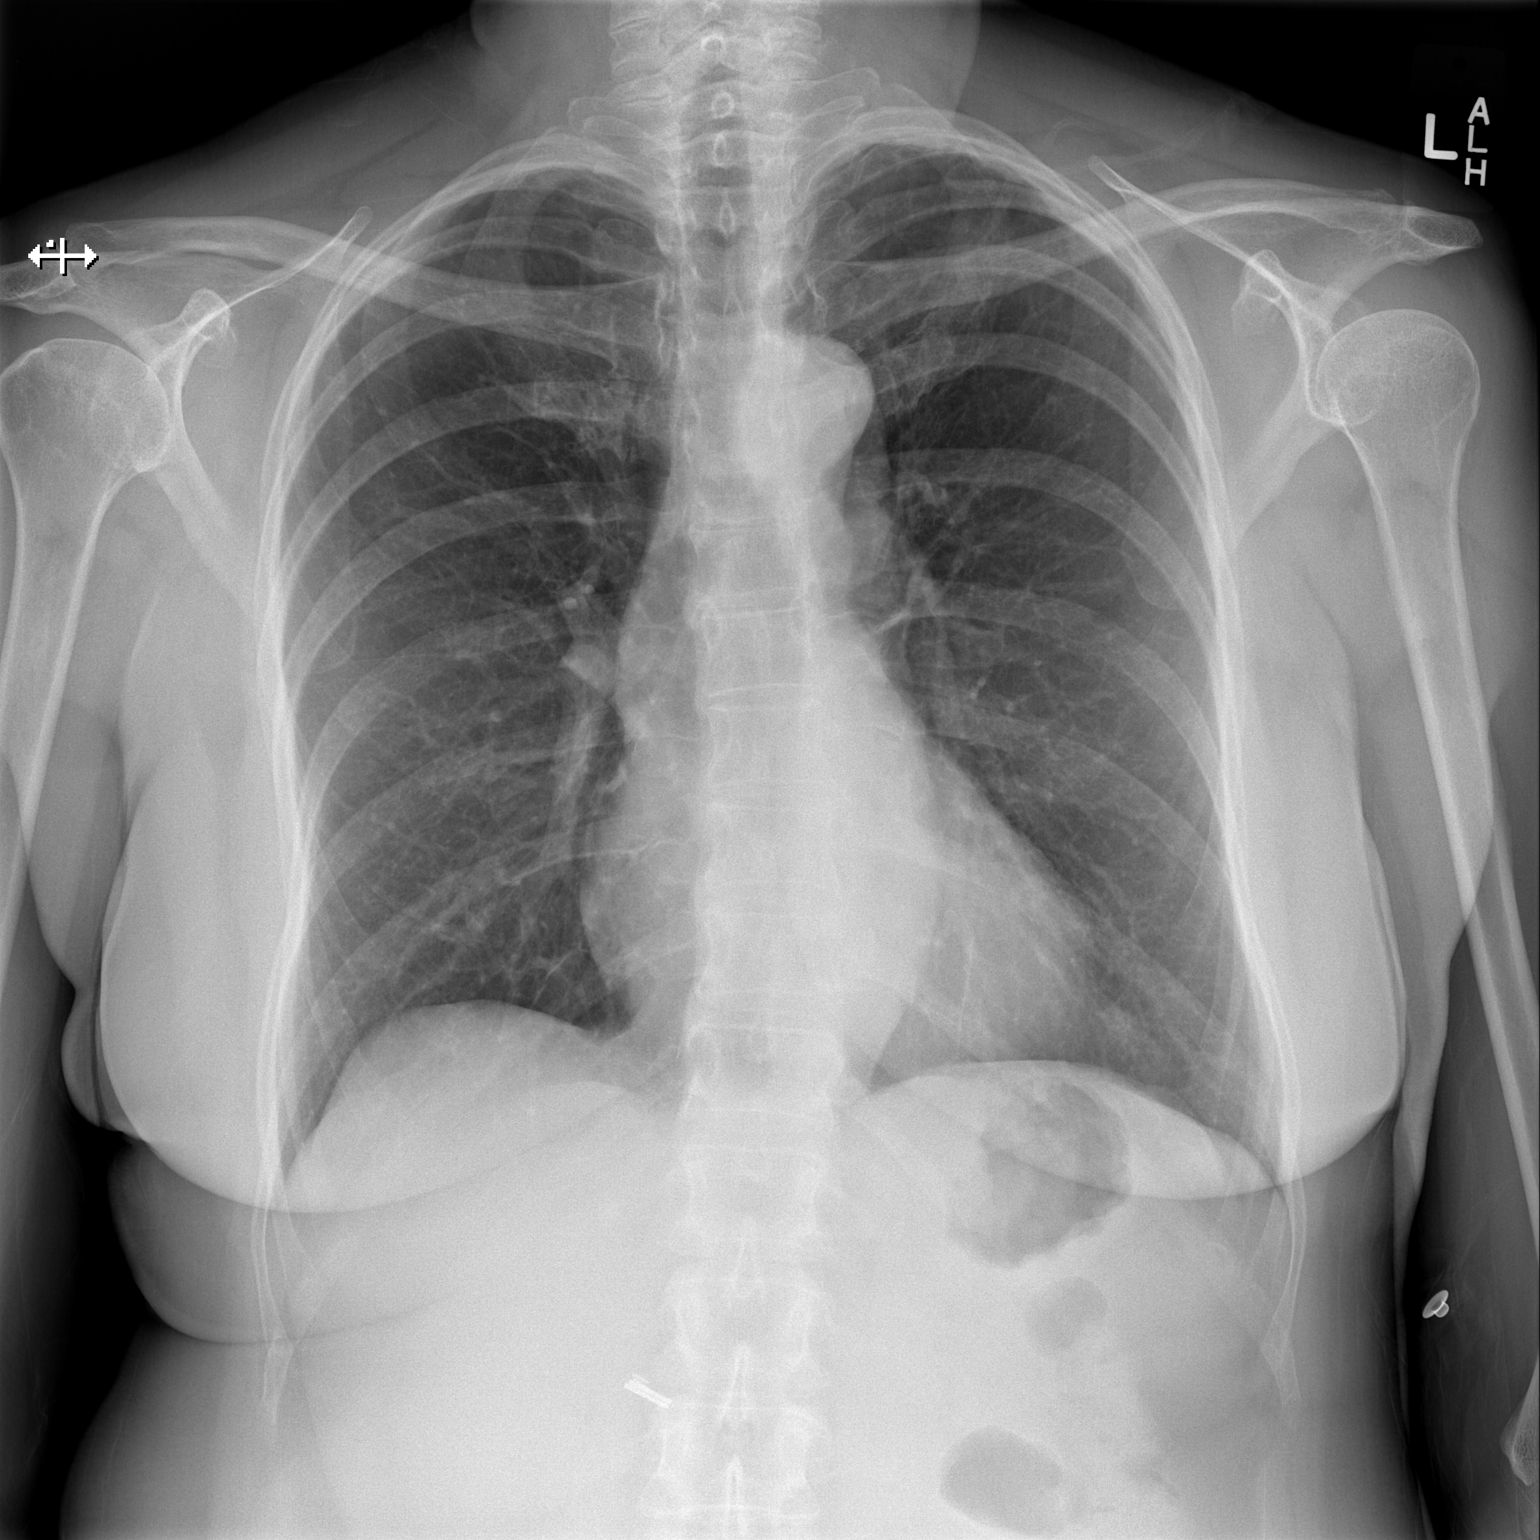

[w chest lat]
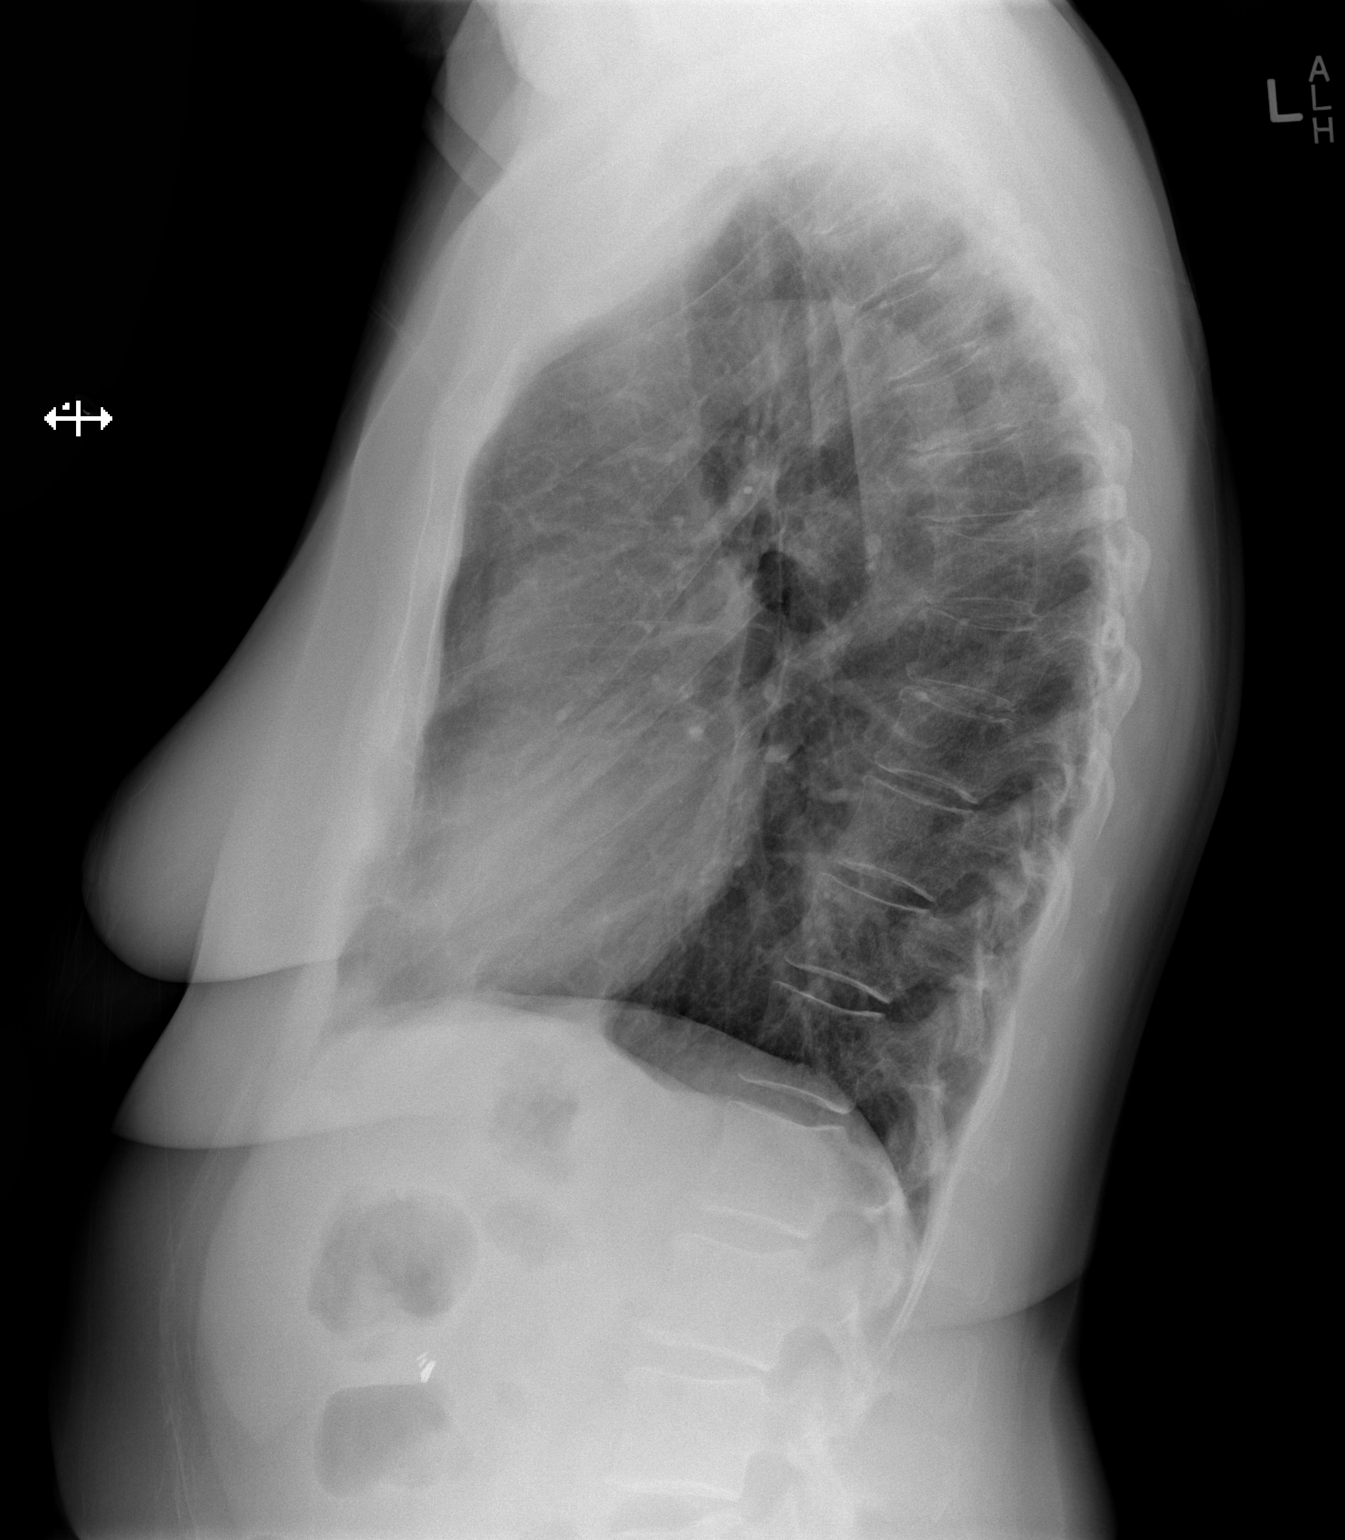

[2 of 2 positions shown; findings below may reference images not displayed]

FINDINGS: The heart size and mediastinal contours are within normal limits.
Both lungs are clear. The visualized skeletal structures are
unremarkable.
IMPRESSION: No active cardiopulmonary disease.

## 2016-04-26 DIAGNOSIS — F419 Anxiety disorder, unspecified: Secondary | ICD-10-CM | POA: Diagnosis not present

## 2016-04-26 DIAGNOSIS — E785 Hyperlipidemia, unspecified: Secondary | ICD-10-CM | POA: Diagnosis not present

## 2016-04-26 DIAGNOSIS — R200 Anesthesia of skin: Secondary | ICD-10-CM | POA: Diagnosis not present

## 2016-04-26 DIAGNOSIS — E039 Hypothyroidism, unspecified: Secondary | ICD-10-CM | POA: Diagnosis not present

## 2016-04-30 DIAGNOSIS — L812 Freckles: Secondary | ICD-10-CM | POA: Diagnosis not present

## 2016-04-30 DIAGNOSIS — L578 Other skin changes due to chronic exposure to nonionizing radiation: Secondary | ICD-10-CM | POA: Diagnosis not present

## 2016-04-30 DIAGNOSIS — I789 Disease of capillaries, unspecified: Secondary | ICD-10-CM | POA: Diagnosis not present

## 2016-04-30 DIAGNOSIS — L718 Other rosacea: Secondary | ICD-10-CM | POA: Diagnosis not present

## 2016-04-30 DIAGNOSIS — L820 Inflamed seborrheic keratosis: Secondary | ICD-10-CM | POA: Diagnosis not present

## 2016-04-30 DIAGNOSIS — L821 Other seborrheic keratosis: Secondary | ICD-10-CM | POA: Diagnosis not present

## 2016-05-07 ENCOUNTER — Ambulatory Visit
Admission: RE | Admit: 2016-05-07 | Discharge: 2016-05-07 | Disposition: A | Discharge status: Home / Self Care | Payer: PPO | Source: Ambulatory Visit | Attending: Internal Medicine | Admitting: Internal Medicine

## 2016-05-07 ENCOUNTER — Other Ambulatory Visit: Payer: Self-pay | Admitting: Cardiology

## 2016-05-07 ENCOUNTER — Ambulatory Visit
Admission: RE | Admit: 2016-05-07 | Discharge: 2016-05-07 | Disposition: A | Discharge status: Home / Self Care | Payer: PPO | Source: Ambulatory Visit | Attending: Cardiology | Admitting: Cardiology

## 2016-05-07 DIAGNOSIS — F419 Anxiety disorder, unspecified: Secondary | ICD-10-CM | POA: Diagnosis not present

## 2016-05-07 DIAGNOSIS — M25512 Pain in left shoulder: Secondary | ICD-10-CM | POA: Diagnosis not present

## 2016-05-07 DIAGNOSIS — E785 Hyperlipidemia, unspecified: Secondary | ICD-10-CM | POA: Diagnosis not present

## 2016-05-07 DIAGNOSIS — S199XXA Unspecified injury of neck, initial encounter: Secondary | ICD-10-CM | POA: Diagnosis not present

## 2016-05-07 DIAGNOSIS — M50322 Other cervical disc degeneration at C5-C6 level: Secondary | ICD-10-CM | POA: Insufficient documentation

## 2016-05-07 DIAGNOSIS — M50323 Other cervical disc degeneration at C6-C7 level: Secondary | ICD-10-CM | POA: Insufficient documentation

## 2016-05-07 DIAGNOSIS — M19012 Primary osteoarthritis, left shoulder: Secondary | ICD-10-CM | POA: Diagnosis not present

## 2016-05-07 DIAGNOSIS — M50321 Other cervical disc degeneration at C4-C5 level: Secondary | ICD-10-CM | POA: Insufficient documentation

## 2016-05-07 DIAGNOSIS — M542 Cervicalgia: Secondary | ICD-10-CM

## 2016-05-07 DIAGNOSIS — F329 Major depressive disorder, single episode, unspecified: Secondary | ICD-10-CM | POA: Diagnosis not present

## 2016-05-07 DIAGNOSIS — R202 Paresthesia of skin: Secondary | ICD-10-CM | POA: Diagnosis not present

## 2016-05-08 DIAGNOSIS — F419 Anxiety disorder, unspecified: Secondary | ICD-10-CM | POA: Diagnosis not present

## 2016-05-08 DIAGNOSIS — I6340 Cerebral infarction due to embolism of unspecified cerebral artery: Secondary | ICD-10-CM | POA: Diagnosis not present

## 2016-05-08 DIAGNOSIS — E785 Hyperlipidemia, unspecified: Secondary | ICD-10-CM | POA: Diagnosis not present

## 2016-05-08 DIAGNOSIS — F329 Major depressive disorder, single episode, unspecified: Secondary | ICD-10-CM | POA: Diagnosis not present

## 2016-06-12 DIAGNOSIS — E785 Hyperlipidemia, unspecified: Secondary | ICD-10-CM | POA: Diagnosis not present

## 2016-06-12 DIAGNOSIS — F329 Major depressive disorder, single episode, unspecified: Secondary | ICD-10-CM | POA: Diagnosis not present

## 2016-06-12 DIAGNOSIS — E039 Hypothyroidism, unspecified: Secondary | ICD-10-CM | POA: Diagnosis not present

## 2016-06-12 DIAGNOSIS — F419 Anxiety disorder, unspecified: Secondary | ICD-10-CM | POA: Diagnosis not present

## 2016-07-02 DIAGNOSIS — L821 Other seborrheic keratosis: Secondary | ICD-10-CM | POA: Diagnosis not present

## 2016-07-02 DIAGNOSIS — L820 Inflamed seborrheic keratosis: Secondary | ICD-10-CM | POA: Diagnosis not present

## 2016-07-02 DIAGNOSIS — L578 Other skin changes due to chronic exposure to nonionizing radiation: Secondary | ICD-10-CM | POA: Diagnosis not present

## 2016-07-17 DIAGNOSIS — E034 Atrophy of thyroid (acquired): Secondary | ICD-10-CM | POA: Diagnosis not present

## 2016-07-17 DIAGNOSIS — I10 Essential (primary) hypertension: Secondary | ICD-10-CM | POA: Diagnosis not present

## 2016-07-17 DIAGNOSIS — E784 Other hyperlipidemia: Secondary | ICD-10-CM | POA: Diagnosis not present

## 2016-07-17 DIAGNOSIS — R5381 Other malaise: Secondary | ICD-10-CM | POA: Diagnosis not present

## 2016-10-26 DIAGNOSIS — F418 Other specified anxiety disorders: Secondary | ICD-10-CM | POA: Diagnosis not present

## 2016-10-26 DIAGNOSIS — E785 Hyperlipidemia, unspecified: Secondary | ICD-10-CM | POA: Diagnosis not present

## 2016-10-26 DIAGNOSIS — E039 Hypothyroidism, unspecified: Secondary | ICD-10-CM | POA: Diagnosis not present

## 2016-10-29 ENCOUNTER — Other Ambulatory Visit: Payer: Self-pay | Admitting: Internal Medicine

## 2016-10-29 DIAGNOSIS — Z1231 Encounter for screening mammogram for malignant neoplasm of breast: Secondary | ICD-10-CM

## 2016-11-01 ENCOUNTER — Telehealth: Payer: Self-pay | Admitting: Gastroenterology

## 2016-11-01 NOTE — Telephone Encounter (Signed)
colonoscopy

## 2016-11-05 ENCOUNTER — Other Ambulatory Visit: Payer: Self-pay

## 2016-11-05 NOTE — Telephone Encounter (Signed)
Patient does not have her insurance on her and the one on file is not verifying through epic. I have advised the patient to call me back as soon as she is able to get to her insurance card to schedule her colonoscopy

## 2016-12-05 ENCOUNTER — Ambulatory Visit: Payer: PPO | Attending: Internal Medicine

## 2016-12-26 ENCOUNTER — Encounter: Payer: Self-pay | Admitting: *Deleted

## 2016-12-28 DIAGNOSIS — H2513 Age-related nuclear cataract, bilateral: Secondary | ICD-10-CM | POA: Diagnosis not present

## 2016-12-31 ENCOUNTER — Other Ambulatory Visit: Payer: Self-pay

## 2016-12-31 MED ORDER — NA SULFATE-K SULFATE-MG SULF 17.5-3.13-1.6 GM/177ML PO SOLN: 1.0000 | ORAL | 0 refills | Status: DC

## 2017-01-01 ENCOUNTER — Encounter: Payer: Self-pay | Admitting: Gastroenterology

## 2017-01-01 NOTE — Discharge Instructions (Signed)

## 2017-01-01 NOTE — Telephone Encounter (Signed)
error 

## 2017-01-04 ENCOUNTER — Ambulatory Visit: Payer: PPO | Admitting: Anesthesiology

## 2017-01-04 ENCOUNTER — Encounter
Admission: RE | Disposition: A | Discharge status: Home / Self Care | Payer: Self-pay | Source: Ambulatory Visit | Attending: Gastroenterology

## 2017-01-04 ENCOUNTER — Ambulatory Visit
Admission: RE | Admit: 2017-01-04 | Discharge: 2017-01-04 | Disposition: A | Discharge status: Home / Self Care | Payer: PPO | Source: Ambulatory Visit | Attending: Gastroenterology | Admitting: Gastroenterology

## 2017-01-04 DIAGNOSIS — K640 First degree hemorrhoids: Secondary | ICD-10-CM | POA: Diagnosis not present

## 2017-01-04 DIAGNOSIS — Z79899 Other long term (current) drug therapy: Secondary | ICD-10-CM | POA: Diagnosis not present

## 2017-01-04 DIAGNOSIS — E7800 Pure hypercholesterolemia, unspecified: Secondary | ICD-10-CM | POA: Insufficient documentation

## 2017-01-04 DIAGNOSIS — F17210 Nicotine dependence, cigarettes, uncomplicated: Secondary | ICD-10-CM | POA: Diagnosis not present

## 2017-01-04 DIAGNOSIS — G629 Polyneuropathy, unspecified: Secondary | ICD-10-CM | POA: Diagnosis not present

## 2017-01-04 DIAGNOSIS — Z860100 Personal history of colon polyps, unspecified: Secondary | ICD-10-CM

## 2017-01-04 DIAGNOSIS — D123 Benign neoplasm of transverse colon: Secondary | ICD-10-CM | POA: Diagnosis not present

## 2017-01-04 DIAGNOSIS — K635 Polyp of colon: Secondary | ICD-10-CM | POA: Diagnosis not present

## 2017-01-04 DIAGNOSIS — E039 Hypothyroidism, unspecified: Secondary | ICD-10-CM | POA: Insufficient documentation

## 2017-01-04 DIAGNOSIS — D127 Benign neoplasm of rectosigmoid junction: Secondary | ICD-10-CM | POA: Diagnosis not present

## 2017-01-04 DIAGNOSIS — F419 Anxiety disorder, unspecified: Secondary | ICD-10-CM | POA: Diagnosis not present

## 2017-01-04 DIAGNOSIS — H3530 Unspecified macular degeneration: Secondary | ICD-10-CM | POA: Diagnosis not present

## 2017-01-04 DIAGNOSIS — F329 Major depressive disorder, single episode, unspecified: Secondary | ICD-10-CM | POA: Diagnosis not present

## 2017-01-04 DIAGNOSIS — Z1211 Encounter for screening for malignant neoplasm of colon: Secondary | ICD-10-CM | POA: Diagnosis not present

## 2017-01-04 HISTORY — PX: POLYPECTOMY: SHX5525

## 2017-01-04 HISTORY — DX: Headache: R51

## 2017-01-04 HISTORY — PX: COLONOSCOPY WITH PROPOFOL: SHX5780

## 2017-01-04 HISTORY — DX: Headache, unspecified: R51.9

## 2017-01-04 HISTORY — DX: Cervicalgia: M54.2

## 2017-01-04 HISTORY — DX: Personal history of tuberculosis: Z86.11

## 2017-01-04 SURGERY — COLONOSCOPY WITH PROPOFOL
Anesthesia: Monitor Anesthesia Care | Wound class: Contaminated

## 2017-01-04 MED ORDER — LIDOCAINE HCL (CARDIAC) 20 MG/ML IV SOLN
INTRAVENOUS | Status: DC | PRN
  Administered 2017-01-04: 40 mg via INTRAVENOUS

## 2017-01-04 MED ORDER — LACTATED RINGERS IV SOLN
INTRAVENOUS | Status: DC
  Administered 2017-01-04: 10:00:00 via INTRAVENOUS

## 2017-01-04 MED ORDER — STERILE WATER FOR IRRIGATION IR SOLN
Status: DC | PRN
  Administered 2017-01-04: 10:00:00

## 2017-01-04 MED ORDER — PROPOFOL 10 MG/ML IV BOLUS
INTRAVENOUS | Status: DC | PRN
  Administered 2017-01-04: 10 mg via INTRAVENOUS
  Administered 2017-01-04: 50 mg via INTRAVENOUS
  Administered 2017-01-04: 20 mg via INTRAVENOUS
  Administered 2017-01-04: 10 mg via INTRAVENOUS
  Administered 2017-01-04: 20 mg via INTRAVENOUS
  Administered 2017-01-04: 10 mg via INTRAVENOUS

## 2017-01-04 SURGICAL SUPPLY — 23 items
CANISTER SUCT 1200ML W/VALVE (MISCELLANEOUS) ×3 IMPLANT
CLIP HMST 235XBRD CATH ROT (MISCELLANEOUS) IMPLANT
CLIP RESOLUTION 360 11X235 (MISCELLANEOUS)
FCP ESCP3.2XJMB 240X2.8X (MISCELLANEOUS)
FORCEPS BIOP RAD 4 LRG CAP 4 (CUTTING FORCEPS) ×1 IMPLANT
FORCEPS BIOP RJ4 240 W/NDL (MISCELLANEOUS)
FORCEPS ESCP3.2XJMB 240X2.8X (MISCELLANEOUS) IMPLANT
GOWN CVR UNV OPN BCK APRN NK (MISCELLANEOUS) ×4 IMPLANT
GOWN ISOL THUMB LOOP REG UNIV (MISCELLANEOUS) ×6
INJECTOR VARIJECT VIN23 (MISCELLANEOUS) IMPLANT
KIT DEFENDO VALVE AND CONN (KITS) IMPLANT
KIT ENDO PROCEDURE OLY (KITS) ×3 IMPLANT
MARKER SPOT ENDO TATTOO 5ML (MISCELLANEOUS) IMPLANT
PAD GROUND ADULT SPLIT (MISCELLANEOUS) IMPLANT
PROBE APC STR FIRE (PROBE) IMPLANT
RETRIEVER NET ROTH 2.5X230 LF (MISCELLANEOUS) IMPLANT
SNARE SHORT THROW 13M SML OVAL (MISCELLANEOUS) IMPLANT
SNARE SHORT THROW 30M LRG OVAL (MISCELLANEOUS) IMPLANT
SNARE SNG USE RND 15MM (INSTRUMENTS) IMPLANT
SPOT EX ENDOSCOPIC TATTOO (MISCELLANEOUS)
TRAP ETRAP POLY (MISCELLANEOUS) IMPLANT
VARIJECT INJECTOR VIN23 (MISCELLANEOUS)
WATER STERILE IRR 250ML POUR (IV SOLUTION) ×3 IMPLANT

## 2017-01-04 NOTE — Anesthesia Postprocedure Evaluation (Signed)
Anesthesia Post Note  Patient: Shelley Sherman  Procedure(s) Performed: Procedure(s) (LRB): COLONOSCOPY WITH PROPOFOL (N/A) POLYPECTOMY  Patient location during evaluation: PACU Anesthesia Type: MAC Level of consciousness: awake and alert Pain management: pain level controlled Vital Signs Assessment: post-procedure vital signs reviewed and stable Respiratory status: spontaneous breathing, nonlabored ventilation, respiratory function stable and patient connected to nasal cannula oxygen Cardiovascular status: stable and blood pressure returned to baseline Anesthetic complications: no    Alisa Graff

## 2017-01-04 NOTE — Op Note (Signed)
[facility] Patient Name: Shelley Sherman Procedure Date: 01/04/2017 10:15 AM MRN: [id number] Account #: [id number] Date of Birth: [birth year] Admit Type: Outpatient Age: 76 Room: [facility] OR ROOM 01 Gender: Female Note Status: Finalized Procedure:            Colonoscopy Indications:          High risk colon cancer surveillance: Personal history                        of colonic polyps Providers:            Lucilla Lame MD, MD Referring MD:         Cletis Athens, MD (Referring MD) Medicines:            Propofol per Anesthesia Complications:        No immediate complications. Procedure:            Pre-Anesthesia Assessment:                       - Prior to the procedure, a History and Physical was                        performed, and patient medications and allergies were                        reviewed. The patient's tolerance of previous                        anesthesia was also reviewed. The risks and benefits of                        the procedure and the sedation options and risks were                        discussed with the patient. All questions were                        answered, and informed consent was obtained. Prior                        Anticoagulants: The patient has taken no previous                        anticoagulant or antiplatelet agents. ASA Grade                        Assessment: II - A patient with mild systemic disease.                        After reviewing the risks and benefits, the patient was                        deemed in satisfactory condition to undergo the                        procedure.                       After obtaining informed consent, the colonoscope was  passed under direct vision. Throughout the procedure,                        the patient's blood pressure, pulse, and oxygen                        saturations were monitored continuously. The was   introduced through the anus and advanced to the the                        cecum, identified by appendiceal orifice and ileocecal                        valve. The colonoscopy was performed without                        difficulty. The patient tolerated the procedure well.                        The quality of the bowel preparation was excellent. Findings:      The perianal and digital rectal examinations were normal.      A 4 mm polyp was found in the transverse colon. The polyp was sessile.       The polyp was removed with a cold biopsy forceps. Resection and       retrieval were complete.      Two sessile polyps were found in the recto-sigmoid colon. The polyps       were 2 to 3 mm in size. These polyps were removed with a cold biopsy       forceps. Resection and retrieval were complete.      Non-bleeding internal hemorrhoids were found during retroflexion. The       hemorrhoids were Grade I (internal hemorrhoids that do not prolapse). Impression:           - One 4 mm polyp in the transverse colon, removed with                        a cold biopsy forceps. Resected and retrieved.                       - Two 2 to 3 mm polyps at the recto-sigmoid colon,                        removed with a cold biopsy forceps. Resected and                        retrieved.                       - Non-bleeding internal hemorrhoids. Recommendation:       - Discharge patient to home.                       - Resume previous diet.                       - Continue present medications.                       - Await pathology results. Procedure Code(s):    --- Professional ---  45380, Colonoscopy, flexible; with biopsy, single or                        multiple Diagnosis Code(s):    --- Professional ---                       Z86.010, Personal history of colonic polyps                       D12.3, Benign neoplasm of transverse colon (hepatic                        flexure or splenic  flexure)                       D12.7, Benign neoplasm of rectosigmoid junction CPT copyright 2016 [employer]. All rights reserved. The codes documented in this report are preliminary and upon coder review may  be revised to meet current compliance requirements. Lucilla Lame MD, MD 01/04/2017 10:37:06 AM This report has been signed electronically. Number of Addenda: 0 Note Initiated On: 01/04/2017 10:15 AM Scope Withdrawal Time: 0 hours 6 minutes 43 seconds  Total Procedure Duration: 0 hours 10 minutes 12 seconds       [facility]

## 2017-01-04 NOTE — Anesthesia Preprocedure Evaluation (Signed)
Anesthesia Evaluation  Patient identified by MRN, date of birth, ID band Patient awake    Reviewed: Allergy & Precautions, H&P , NPO status , Patient's Chart, lab work & pertinent test results, reviewed documented beta blocker date and time   History of Anesthesia Complications (+) PONV and history of anesthetic complications (PONV)  Airway Mallampati: II  TM Distance: >3 FB Neck ROM: full    Dental no notable dental hx.    Pulmonary neg pulmonary ROS, Current Smoker,    Pulmonary exam normal breath sounds clear to auscultation       Cardiovascular Exercise Tolerance: Good negative cardio ROS   Rhythm:regular Rate:Normal     Neuro/Psych  Headaches, PSYCHIATRIC DISORDERS (depression/anxiety)  Neuromuscular disease (peripheral neuropathy)    GI/Hepatic negative GI ROS, Neg liver ROS,   Endo/Other  Hypothyroidism   Renal/GU negative Renal ROS Bladder dysfunction (urinary urgency)      Musculoskeletal   Abdominal   Peds  Hematology negative hematology ROS (+)   Anesthesia Other Findings   Reproductive/Obstetrics negative OB ROS                             Anesthesia Physical Anesthesia Plan  ASA: II  Anesthesia Plan: MAC   Post-op Pain Management:    Induction:   Airway Management Planned:   Additional Equipment:   Intra-op Plan:   Post-operative Plan:   Informed Consent: I have reviewed the patients History and Physical, chart, labs and discussed the procedure including the risks, benefits and alternatives for the proposed anesthesia with the patient or authorized representative who has indicated his/her understanding and acceptance.   Dental Advisory Given  Plan Discussed with: CRNA  Anesthesia Plan Comments:         Anesthesia Quick Evaluation

## 2017-01-04 NOTE — H&P (Signed)
Lucilla Lame, MD [facility] [address] [place], [place] [address] Phone: [phone] Fax : [phone]  Primary Care Physician:  Cletis Athens, MD Primary Gastroenterologist:  Dr. Allen Norris  Pre-Procedure History & Physical: HPI:  Shelley Sherman is a 76 y.o. female is here for an colonoscopy.   Past Medical History:  Diagnosis Date  . Anxiety    takes Xanax nightly  . Cataracts, bilateral   . Depression 09/08/2014   takes Effexor daily  . Dizziness    in May 2015  . H/O tuberculosis    Possible. Lung xrays showed scarring which Dr said could have been TB as child.  Marland Kitchen Headache    sinus  . History of colon polyps   . History of migraine    last one has been over a yr ago  . Hypercholesteremia    takes Lovastatin daily  . Hypothyroidism    takes Synthroid daily  . Joint pain   . Macular degeneration, dry   . Neck pain   . Peripheral neuropathy ([place])   . PONV (postoperative nausea and vomiting)    slow to wake up  . Primary localized osteoarthritis of left knee   . Urinary urgency     Past Surgical History:  Procedure Laterality Date  . CHOLECYSTECTOMY  1984  . COLONOSCOPY    . [place]  . KNEE ARTHROSCOPY W/ MENISCECTOMY Left 2008  . OVARIAN CYST REMOVAL  1972  . TONSILECTOMY, ADENOIDECTOMY, BILATERAL MYRINGOTOMY AND TUBES  1947  . 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 Authorizing Provider  b complex vitamins tablet Take 1 tablet by mouth daily.   Yes Historical Provider, MD  Biotin w/ Vitamins C & E (HAIR/SKIN/NAILS PO) Take by mouth daily.   Yes Historical Provider, MD  Cholecalciferol (VITAMIN D PO) Take by mouth daily.   Yes Historical Provider, MD  hydrOXYzine (ATARAX/VISTARIL) 25 MG  tablet Take 25 mg by mouth 3 (three) times daily as needed for anxiety.    Yes Historical Provider, MD  levothyroxine (SYNTHROID, LEVOTHROID) 75 MCG tablet Take 75 mcg by mouth daily before breakfast.   Yes Historical Provider, MD  Na Sulfate-K Sulfate-Mg Sulf (SUPREP BOWEL PREP KIT) 17.5-3.13-1.6 GM/180ML SOLN Take 1 kit by mouth as directed. 12/31/16  Yes Lucilla Lame, MD  venlafaxine XR (EFFEXOR-XR) 75 MG 24 hr capsule Take 75 mg by mouth daily with breakfast.   Yes Historical Provider, MD  ALPRAZolam (XANAX) 0.25 MG tablet Take 0.25 mg by mouth at bedtime as needed for sleep.    Historical Provider, MD    Allergies as of 11/05/2016 - Review Complete 09/20/2014  Allergen Reaction Noted  . Sulfa antibiotics Anaphylaxis 09/08/2014  . Aspirin  09/10/2014  . Codeine Itching 09/10/2014    Family History  Problem Relation Age of Onset  . Seizures Sister   . Breast cancer Paternal Grandmother     Social History   Social History  . Marital status: Married    Spouse name: N/A  . Number of children: N/A  . Years of education: N/A   Occupational History  . Not on file.   Social History Main Topics  . Smoking status: Current Every Day Smoker    Packs/day: 0.25  Years: 30.00    Types: Cigarettes  . Smokeless tobacco: Never Used  . Alcohol use No  . Drug use: Yes    Types: Marijuana     Comment: last week  . Sexual activity: Yes   Other Topics Concern  . Not on file   Social History Narrative  . No narrative on file    Review of Systems: See HPI, otherwise negative ROS  Physical Exam: BP 125/74   Pulse 81   Temp 98.4 F (36.9 C) (Temporal)   Resp 16   Ht _0  (1.651 m)   Wt 165 lb (74.8 kg)   SpO2 99%   BMI 27.46 kg/m  General:   Alert,  pleasant and cooperative in NAD Head:  Normocephalic and atraumatic. Neck:  Supple; no masses or thyromegaly. Lungs:  Clear throughout to auscultation.    Heart:  Regular rate and rhythm. Abdomen:  Soft, nontender and  nondistended. Normal bowel sounds, without guarding, and without rebound.   Neurologic:  Alert and  oriented x4;  grossly normal neurologically.  Impression/Plan: Shelley Sherman is here for an colonoscopy to be performed for history of polyps  Risks, benefits, limitations, and alternatives regarding  colonoscopy have been reviewed with the patient.  Questions have been answered.  All parties agreeable.   Lucilla Lame, MD  01/04/2017, 10:11 AM

## 2017-01-04 NOTE — Anesthesia Procedure Notes (Signed)
Procedure Name: MAC Performed by: Mayme Genta Pre-anesthesia Checklist: Patient identified, Emergency Drugs available, Suction available, Timeout performed and Patient being monitored Patient Re-evaluated:Patient Re-evaluated prior to inductionOxygen Delivery Method: Nasal cannula Placement Confirmation: positive ETCO2

## 2017-01-04 NOTE — Transfer of Care (Signed)
Immediate Anesthesia Transfer of Care Note  Patient: Shelley Sherman  Procedure(s) Performed: Procedure(s): COLONOSCOPY WITH PROPOFOL (N/A) POLYPECTOMY  Patient Location: PACU  Anesthesia Type: MAC  Level of Consciousness: awake, alert  and patient cooperative  Airway and Oxygen Therapy: Patient Spontanous Breathing and Patient connected to supplemental oxygen  Post-op Assessment: Post-op Vital signs reviewed, Patient's Cardiovascular Status Stable, Respiratory Function Stable, Patent Airway and No signs of Nausea or vomiting  Post-op Vital Signs: Reviewed and stable  Complications: No apparent anesthesia complications

## 2017-01-07 ENCOUNTER — Encounter: Payer: Self-pay | Admitting: Gastroenterology

## 2017-01-08 ENCOUNTER — Encounter: Payer: Self-pay | Admitting: Gastroenterology

## 2017-01-09 ENCOUNTER — Encounter: Payer: Self-pay | Admitting: Gastroenterology

## 2017-01-18 ENCOUNTER — Ambulatory Visit
Admission: RE | Admit: 2017-01-18 | Discharge: 2017-01-18 | Disposition: A | Discharge status: Home / Self Care | Payer: PPO | Source: Ambulatory Visit | Attending: Internal Medicine | Admitting: Internal Medicine

## 2017-01-18 DIAGNOSIS — Z1231 Encounter for screening mammogram for malignant neoplasm of breast: Secondary | ICD-10-CM | POA: Diagnosis not present

## 2017-07-22 DIAGNOSIS — H6980 Other specified disorders of Eustachian tube, unspecified ear: Secondary | ICD-10-CM | POA: Diagnosis not present
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
## 2017-08-08 DIAGNOSIS — L689 Hypertrichosis, unspecified: Secondary | ICD-10-CM | POA: Diagnosis not present

## 2017-08-08 DIAGNOSIS — L299 Pruritus, unspecified: Secondary | ICD-10-CM | POA: Diagnosis not present

## 2017-08-08 DIAGNOSIS — L219 Seborrheic dermatitis, unspecified: Secondary | ICD-10-CM | POA: Diagnosis not present

## 2017-08-21 DIAGNOSIS — F419 Anxiety disorder, unspecified: Secondary | ICD-10-CM | POA: Diagnosis not present

## 2017-08-21 DIAGNOSIS — F329 Major depressive disorder, single episode, unspecified: Secondary | ICD-10-CM | POA: Diagnosis not present

## 2017-08-21 DIAGNOSIS — E039 Hypothyroidism, unspecified: Secondary | ICD-10-CM | POA: Diagnosis not present

## 2017-08-21 DIAGNOSIS — E785 Hyperlipidemia, unspecified: Secondary | ICD-10-CM | POA: Diagnosis not present

## 2017-08-22 DIAGNOSIS — R5381 Other malaise: Secondary | ICD-10-CM | POA: Diagnosis not present

## 2017-08-22 DIAGNOSIS — E784 Other hyperlipidemia: Secondary | ICD-10-CM | POA: Diagnosis not present

## 2017-08-22 DIAGNOSIS — I10 Essential (primary) hypertension: Secondary | ICD-10-CM | POA: Diagnosis not present

## 2017-08-22 DIAGNOSIS — E034 Atrophy of thyroid (acquired): Secondary | ICD-10-CM | POA: Diagnosis not present

## 2017-08-28 DIAGNOSIS — F329 Major depressive disorder, single episode, unspecified: Secondary | ICD-10-CM | POA: Diagnosis not present

## 2017-08-28 DIAGNOSIS — E039 Hypothyroidism, unspecified: Secondary | ICD-10-CM | POA: Diagnosis not present

## 2017-08-28 DIAGNOSIS — E0590 Thyrotoxicosis, unspecified without thyrotoxic crisis or storm: Secondary | ICD-10-CM | POA: Diagnosis not present

## 2017-08-28 DIAGNOSIS — F419 Anxiety disorder, unspecified: Secondary | ICD-10-CM | POA: Diagnosis not present

## 2017-10-21 DIAGNOSIS — L219 Seborrheic dermatitis, unspecified: Secondary | ICD-10-CM | POA: Diagnosis not present

## 2017-10-21 DIAGNOSIS — L820 Inflamed seborrheic keratosis: Secondary | ICD-10-CM | POA: Diagnosis not present

## 2017-10-21 DIAGNOSIS — L821 Other seborrheic keratosis: Secondary | ICD-10-CM | POA: Diagnosis not present

## 2018-05-30 DIAGNOSIS — E785 Hyperlipidemia, unspecified: Secondary | ICD-10-CM | POA: Diagnosis not present

## 2018-05-30 DIAGNOSIS — E039 Hypothyroidism, unspecified: Secondary | ICD-10-CM | POA: Diagnosis not present

## 2018-05-30 DIAGNOSIS — F419 Anxiety disorder, unspecified: Secondary | ICD-10-CM | POA: Diagnosis not present

## 2018-05-30 DIAGNOSIS — F329 Major depressive disorder, single episode, unspecified: Secondary | ICD-10-CM | POA: Diagnosis not present

## 2018-06-02 DIAGNOSIS — H2513 Age-related nuclear cataract, bilateral: Secondary | ICD-10-CM | POA: Diagnosis not present

## 2018-07-11 ENCOUNTER — Ambulatory Visit (INDEPENDENT_AMBULATORY_CARE_PROVIDER_SITE_OTHER): Payer: PPO | Admitting: Internal Medicine

## 2018-07-11 DIAGNOSIS — Z789 Other specified health status: Secondary | ICD-10-CM

## 2018-07-11 DIAGNOSIS — Z7184 Encounter for health counseling related to travel: Secondary | ICD-10-CM

## 2018-07-11 DIAGNOSIS — Z7185 Encounter for immunization safety counseling: Secondary | ICD-10-CM

## 2018-07-11 DIAGNOSIS — Z7189 Other specified counseling: Secondary | ICD-10-CM

## 2018-07-11 DIAGNOSIS — Z9189 Other specified personal risk factors, not elsewhere classified: Secondary | ICD-10-CM

## 2018-07-11 MED ORDER — AZITHROMYCIN 500 MG PO TABS: 500.0000 mg | ORAL_TABLET | Freq: Every day | ORAL | 0 refills | Status: DC

## 2018-07-11 MED ORDER — ATOVAQUONE-PROGUANIL HCL 250-100 MG PO TABS
1.0000 | ORAL_TABLET | Freq: Every day | ORAL | 0 refills | Status: DC
Start: 2018-07-11 — End: 2020-06-01

## 2018-07-11 NOTE — Progress Notes (Signed)
Subjective:   Shelley Sherman is a 77 y.o. female who presents to the Infectious Disease clinic for travel consultation. Planned departure date: August 27th      Planned return date: Sept 19th Countries of travel: [place], [place], [place] Areas in country:  Urban and rural Accommodations: hotels and safari Purpose of travel: vacation Prior travel out of [place]: yes, previously resided in [place]     Objective:   Medications:reviewed.   All: sulfa  Current Outpatient Medications:  .  ALPRAZolam (XANAX) 0.25 MG tablet, Take 0.25 mg by mouth at bedtime as needed for sleep., Disp: , Rfl:  .  atovaquone-proguanil (MALARONE) 250-100 MG TABS tablet, Take 1 tablet by mouth daily. Please start taking on August 27th take daily until complete, Disp: 20 tablet, Rfl: 0 .  azithromycin (ZITHROMAX) 500 MG tablet, Take 1 tablet (500 mg total) by mouth daily. If you have 3+ watery diarrhea/ 24hr. Can stop taking if diarrhea resolves, Disp: 5 tablet, Rfl: 0 .  b complex vitamins tablet, Take 1 tablet by mouth daily., Disp: , Rfl:  .  Biotin w/ Vitamins C & E (HAIR/SKIN/NAILS PO), Take by mouth daily., Disp: , Rfl:  .  Cholecalciferol (VITAMIN D PO), Take by mouth daily., Disp: , Rfl:  .  hydrOXYzine (ATARAX/VISTARIL) 25 MG tablet, Take 25 mg by mouth 3 (three) times daily as needed for anxiety. , Disp: , Rfl:  .  levothyroxine (SYNTHROID, LEVOTHROID) 75 MCG tablet, Take 75 mcg by mouth daily before breakfast., Disp: , Rfl:  .  Na Sulfate-K Sulfate-Mg Sulf (SUPREP BOWEL PREP KIT) 17.5-3.13-1.6 GM/180ML SOLN, Take 1 kit by mouth as directed., Disp: 1 Bottle, Rfl: 0 .  venlafaxine XR (EFFEXOR-XR) 75 MG 24 hr capsule, Take 75 mg by mouth daily with breakfast., Disp: , Rfl:      Assessment:   No contraindications to travel. none   Plan:   Pre travel vaccination = unclear if she has had hepatitis A or any recent typhoid vaccination. She declines.  Malaria prophylaxis = gave mosquito bite  precautions/ use of deet and premethrin. Also gave rx for malarone  Traveler's diarrhea = gave rx for azithromycin to fill if needed 

## 2018-08-05 DIAGNOSIS — F329 Major depressive disorder, single episode, unspecified: Secondary | ICD-10-CM | POA: Diagnosis not present

## 2018-08-05 DIAGNOSIS — E039 Hypothyroidism, unspecified: Secondary | ICD-10-CM | POA: Diagnosis not present

## 2018-08-05 DIAGNOSIS — E0590 Thyrotoxicosis, unspecified without thyrotoxic crisis or storm: Secondary | ICD-10-CM | POA: Diagnosis not present

## 2018-08-05 DIAGNOSIS — Z0000 Encounter for general adult medical examination without abnormal findings: Secondary | ICD-10-CM | POA: Diagnosis not present

## 2018-08-05 DIAGNOSIS — F419 Anxiety disorder, unspecified: Secondary | ICD-10-CM | POA: Diagnosis not present

## 2018-10-21 DIAGNOSIS — R5381 Other malaise: Secondary | ICD-10-CM | POA: Diagnosis not present

## 2018-10-21 DIAGNOSIS — E7849 Other hyperlipidemia: Secondary | ICD-10-CM | POA: Diagnosis not present

## 2018-10-21 DIAGNOSIS — E034 Atrophy of thyroid (acquired): Secondary | ICD-10-CM | POA: Diagnosis not present

## 2018-10-21 DIAGNOSIS — I10 Essential (primary) hypertension: Secondary | ICD-10-CM | POA: Diagnosis not present

## 2018-10-22 DIAGNOSIS — L219 Seborrheic dermatitis, unspecified: Secondary | ICD-10-CM | POA: Diagnosis not present

## 2018-10-22 DIAGNOSIS — L821 Other seborrheic keratosis: Secondary | ICD-10-CM | POA: Diagnosis not present

## 2018-10-22 DIAGNOSIS — L570 Actinic keratosis: Secondary | ICD-10-CM | POA: Diagnosis not present

## 2018-10-22 DIAGNOSIS — Z1283 Encounter for screening for malignant neoplasm of skin: Secondary | ICD-10-CM | POA: Diagnosis not present

## 2018-10-22 DIAGNOSIS — L578 Other skin changes due to chronic exposure to nonionizing radiation: Secondary | ICD-10-CM | POA: Diagnosis not present

## 2018-10-22 DIAGNOSIS — L820 Inflamed seborrheic keratosis: Secondary | ICD-10-CM | POA: Diagnosis not present

## 2019-05-06 DIAGNOSIS — L578 Other skin changes due to chronic exposure to nonionizing radiation: Secondary | ICD-10-CM | POA: Diagnosis not present

## 2019-05-06 DIAGNOSIS — L821 Other seborrheic keratosis: Secondary | ICD-10-CM | POA: Diagnosis not present

## 2019-05-06 DIAGNOSIS — L570 Actinic keratosis: Secondary | ICD-10-CM | POA: Diagnosis not present

## 2019-05-06 DIAGNOSIS — L820 Inflamed seborrheic keratosis: Secondary | ICD-10-CM | POA: Diagnosis not present

## 2019-07-20 ENCOUNTER — Other Ambulatory Visit: Payer: Self-pay

## 2019-09-23 DIAGNOSIS — M5440 Lumbago with sciatica, unspecified side: Secondary | ICD-10-CM | POA: Diagnosis not present

## 2019-09-23 DIAGNOSIS — Z23 Encounter for immunization: Secondary | ICD-10-CM | POA: Diagnosis not present

## 2019-09-23 DIAGNOSIS — F329 Major depressive disorder, single episode, unspecified: Secondary | ICD-10-CM | POA: Diagnosis not present

## 2019-09-23 DIAGNOSIS — M170 Bilateral primary osteoarthritis of knee: Secondary | ICD-10-CM | POA: Diagnosis not present

## 2019-09-23 DIAGNOSIS — F419 Anxiety disorder, unspecified: Secondary | ICD-10-CM | POA: Diagnosis not present

## 2019-09-30 DIAGNOSIS — S90572A Other superficial bite of ankle, left ankle, initial encounter: Secondary | ICD-10-CM | POA: Diagnosis not present

## 2019-09-30 DIAGNOSIS — L814 Other melanin hyperpigmentation: Secondary | ICD-10-CM | POA: Diagnosis not present

## 2019-09-30 DIAGNOSIS — D1800 Hemangioma unspecified site: Secondary | ICD-10-CM | POA: Diagnosis not present

## 2019-09-30 DIAGNOSIS — L820 Inflamed seborrheic keratosis: Secondary | ICD-10-CM | POA: Diagnosis not present

## 2019-09-30 DIAGNOSIS — L821 Other seborrheic keratosis: Secondary | ICD-10-CM | POA: Diagnosis not present

## 2019-09-30 DIAGNOSIS — Z1283 Encounter for screening for malignant neoplasm of skin: Secondary | ICD-10-CM | POA: Diagnosis not present

## 2019-09-30 DIAGNOSIS — S90474A Other superficial bite of right lesser toe(s), initial encounter: Secondary | ICD-10-CM | POA: Diagnosis not present

## 2019-09-30 DIAGNOSIS — L719 Rosacea, unspecified: Secondary | ICD-10-CM | POA: Diagnosis not present

## 2019-10-05 DIAGNOSIS — E039 Hypothyroidism, unspecified: Secondary | ICD-10-CM | POA: Diagnosis not present

## 2019-10-05 DIAGNOSIS — M7050 Other bursitis of knee, unspecified knee: Secondary | ICD-10-CM | POA: Diagnosis not present

## 2019-10-05 DIAGNOSIS — F419 Anxiety disorder, unspecified: Secondary | ICD-10-CM | POA: Diagnosis not present

## 2019-10-05 DIAGNOSIS — M5440 Lumbago with sciatica, unspecified side: Secondary | ICD-10-CM | POA: Diagnosis not present

## 2019-10-14 DIAGNOSIS — R202 Paresthesia of skin: Secondary | ICD-10-CM | POA: Diagnosis not present

## 2019-10-14 DIAGNOSIS — R200 Anesthesia of skin: Secondary | ICD-10-CM | POA: Diagnosis not present

## 2019-10-14 DIAGNOSIS — M5136 Other intervertebral disc degeneration, lumbar region: Secondary | ICD-10-CM | POA: Diagnosis not present

## 2019-10-14 DIAGNOSIS — E538 Deficiency of other specified B group vitamins: Secondary | ICD-10-CM | POA: Diagnosis not present

## 2019-10-14 DIAGNOSIS — E531 Pyridoxine deficiency: Secondary | ICD-10-CM | POA: Diagnosis not present

## 2019-10-14 DIAGNOSIS — E559 Vitamin D deficiency, unspecified: Secondary | ICD-10-CM | POA: Diagnosis not present

## 2019-10-14 DIAGNOSIS — M48061 Spinal stenosis, lumbar region without neurogenic claudication: Secondary | ICD-10-CM | POA: Diagnosis not present

## 2019-10-14 DIAGNOSIS — M519 Unspecified thoracic, thoracolumbar and lumbosacral intervertebral disc disorder: Secondary | ICD-10-CM | POA: Diagnosis not present

## 2019-10-14 DIAGNOSIS — E519 Thiamine deficiency, unspecified: Secondary | ICD-10-CM | POA: Diagnosis not present

## 2019-10-29 ENCOUNTER — Other Ambulatory Visit: Payer: Self-pay | Admitting: Internal Medicine

## 2019-10-29 DIAGNOSIS — Z1231 Encounter for screening mammogram for malignant neoplasm of breast: Secondary | ICD-10-CM

## 2019-11-03 ENCOUNTER — Ambulatory Visit
Admission: RE | Admit: 2019-11-03 | Discharge: 2019-11-03 | Disposition: A | Discharge status: Home / Self Care | Payer: PPO | Source: Ambulatory Visit | Attending: Internal Medicine | Admitting: Internal Medicine

## 2019-11-03 DIAGNOSIS — Z1231 Encounter for screening mammogram for malignant neoplasm of breast: Secondary | ICD-10-CM | POA: Insufficient documentation

## 2019-11-18 DIAGNOSIS — R202 Paresthesia of skin: Secondary | ICD-10-CM | POA: Diagnosis not present

## 2019-11-18 DIAGNOSIS — R200 Anesthesia of skin: Secondary | ICD-10-CM | POA: Diagnosis not present

## 2019-12-23 DIAGNOSIS — R234 Changes in skin texture: Secondary | ICD-10-CM | POA: Diagnosis not present

## 2019-12-23 DIAGNOSIS — L820 Inflamed seborrheic keratosis: Secondary | ICD-10-CM | POA: Diagnosis not present

## 2019-12-23 DIAGNOSIS — L719 Rosacea, unspecified: Secondary | ICD-10-CM | POA: Diagnosis not present

## 2019-12-23 DIAGNOSIS — L821 Other seborrheic keratosis: Secondary | ICD-10-CM | POA: Diagnosis not present

## 2019-12-23 DIAGNOSIS — L987 Excessive and redundant skin and subcutaneous tissue: Secondary | ICD-10-CM | POA: Diagnosis not present

## 2019-12-23 DIAGNOSIS — L578 Other skin changes due to chronic exposure to nonionizing radiation: Secondary | ICD-10-CM | POA: Diagnosis not present

## 2019-12-25 DIAGNOSIS — M519 Unspecified thoracic, thoracolumbar and lumbosacral intervertebral disc disorder: Secondary | ICD-10-CM | POA: Diagnosis not present

## 2019-12-25 DIAGNOSIS — G608 Other hereditary and idiopathic neuropathies: Secondary | ICD-10-CM | POA: Diagnosis not present

## 2020-01-11 DIAGNOSIS — H353131 Nonexudative age-related macular degeneration, bilateral, early dry stage: Secondary | ICD-10-CM | POA: Diagnosis not present

## 2020-02-18 DIAGNOSIS — M519 Unspecified thoracic, thoracolumbar and lumbosacral intervertebral disc disorder: Secondary | ICD-10-CM | POA: Diagnosis not present

## 2020-02-18 DIAGNOSIS — G608 Other hereditary and idiopathic neuropathies: Secondary | ICD-10-CM | POA: Diagnosis not present

## 2020-04-13 DIAGNOSIS — Z20828 Contact with and (suspected) exposure to other viral communicable diseases: Secondary | ICD-10-CM | POA: Diagnosis not present

## 2020-05-10 DIAGNOSIS — M170 Bilateral primary osteoarthritis of knee: Secondary | ICD-10-CM | POA: Diagnosis not present

## 2020-06-01 ENCOUNTER — Ambulatory Visit (INDEPENDENT_AMBULATORY_CARE_PROVIDER_SITE_OTHER): Payer: PPO | Admitting: Internal Medicine

## 2020-06-01 ENCOUNTER — Other Ambulatory Visit: Payer: Self-pay

## 2020-06-01 ENCOUNTER — Encounter: Payer: Self-pay | Admitting: Internal Medicine

## 2020-06-01 VITALS — BP 117/74 | HR 72 | Ht 65.0 in | Wt 150.3 lb

## 2020-06-01 DIAGNOSIS — F419 Anxiety disorder, unspecified: Secondary | ICD-10-CM

## 2020-06-01 DIAGNOSIS — Z01818 Encounter for other preprocedural examination: Secondary | ICD-10-CM | POA: Diagnosis not present

## 2020-06-01 DIAGNOSIS — M1711 Unilateral primary osteoarthritis, right knee: Secondary | ICD-10-CM

## 2020-06-01 DIAGNOSIS — E063 Autoimmune thyroiditis: Secondary | ICD-10-CM | POA: Diagnosis not present

## 2020-06-01 DIAGNOSIS — E7800 Pure hypercholesterolemia, unspecified: Secondary | ICD-10-CM | POA: Diagnosis not present

## 2020-06-01 DIAGNOSIS — M1710 Unilateral primary osteoarthritis, unspecified knee: Secondary | ICD-10-CM | POA: Diagnosis not present

## 2020-06-01 DIAGNOSIS — E038 Other specified hypothyroidism: Secondary | ICD-10-CM

## 2020-06-01 MED ORDER — ALPRAZOLAM 0.25 MG PO TABS: 0.2500 mg | ORAL_TABLET | Freq: Two times a day (BID) | ORAL | 0 refills | Status: AC | PRN

## 2020-06-01 NOTE — Assessment & Plan Note (Signed)
Patient is having severe degenerative arthritis of the right knee her knee is bone-on-bone.  She is going to have surgery for the right knee.  He is stable at the present time for surgery is approved.

## 2020-06-01 NOTE — Assessment & Plan Note (Signed)
Patient has chronic anxiety with insomnia I gave her a prescription of Xanax

## 2020-06-01 NOTE — Assessment & Plan Note (Signed)
Stable

## 2020-06-01 NOTE — Progress Notes (Signed)
Established Patient Office Visit  SUBJECTIVE:  Patient ID: Shelley Sherman, female    DOB: [birth year]  Age: 79 y.o. MRN: [id number]  CC:  Chief Complaint  Patient presents with  . Surgical clearance    Pt is scheduled to have a right knee replacement    HPI  Shelley Sherman presents for surgical clearance for her right knee replacement surgery.  She is unable to take opiate pain medication. She does take Xanax for her history of anxiety, this will be refilled for her to help manage the anxiety of surgery.   She has a history of postoperative nausea and vomiting, and she is slow to wake up.    She is up to date with her mammography, which was last completed on 11/03/2019.    Past Medical History:  Diagnosis Date  . Anxiety    takes Xanax nightly  . Cataracts, bilateral   . Depression 09/08/2014   takes Effexor daily  . Dizziness    in May 2015  . H/O tuberculosis    Possible. Lung xrays showed scarring which Dr said could have been TB as child.  Marland Kitchen Headache    sinus  . History of colon polyps   . History of migraine    last one has been over a yr ago  . Hypercholesteremia    takes Lovastatin daily  . Hypothyroidism    takes Synthroid daily  . Joint pain   . Macular degeneration, dry   . Neck pain   . Peripheral neuropathy   . PONV (postoperative nausea and vomiting)    slow to wake up  . Primary localized osteoarthritis of left knee   . Urinary urgency     Past Surgical History:  Procedure Laterality Date  . CHOLECYSTECTOMY  1984  . COLONOSCOPY    . COLONOSCOPY WITH PROPOFOL N/A 01/04/2017   Procedure: COLONOSCOPY WITH PROPOFOL;  Surgeon: Lucilla Lame, MD;  Location: [facility];  Service: Endoscopy;  Laterality: N/A;  . [place]  . KNEE ARTHROSCOPY W/ MENISCECTOMY Left 2008  . OVARIAN CYST REMOVAL  1972  . POLYPECTOMY  01/04/2017   Procedure: POLYPECTOMY;  Surgeon: Lucilla Lame, MD;  Location: [facility];   Service: Endoscopy;;  . TONSILECTOMY, ADENOIDECTOMY, BILATERAL MYRINGOTOMY AND TUBES  1947  . 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 Seizures Sister   . Breast cancer Paternal Grandmother   . Breast cancer Cousin   . Breast cancer Cousin     Social History   Socioeconomic History  . Marital status: Married    Spouse name: Not on file  . Number of children: Not on file  . Years of education: Not on file  . Highest education level: Not on file  Occupational History  . Not on file  Tobacco Use  . Smoking status: Former Smoker    Packs/day: 0.25    Years: 30.00    Pack years: 7.50    Types: Cigarettes    Quit date: 04/16/2020    Years since quitting: 0.1  . Smokeless tobacco: Never Used  Substance and Sexual Activity  . Alcohol use: No  . Drug use: Yes    Types: Marijuana    Comment: last week  . Sexual  activity: Yes  Other Topics Concern  . Not on file  Social History Narrative  . Not on file   Social Determinants of Health   Financial Resource Strain:   . Difficulty of Paying Living Expenses:   Food Insecurity:   . Worried About Charity fundraiser in the Last Year:   . Arboriculturist in the Last Year:   Transportation Needs:   . Film/video editor (Medical):   Marland Kitchen Lack of Transportation (Non-Medical):   Physical Activity:   . Days of Exercise per Week:   . Minutes of Exercise per Session:   Stress:   . Feeling of Stress :   Social Connections:   . Frequency of Communication with Friends and Family:   . Frequency of Social Gatherings with Friends and Family:   . Attends Religious Services:   . Active Member of Clubs or Organizations:   . Attends Archivist Meetings:   Marland Kitchen Marital Status:   Intimate Partner Violence:   . Fear of  Current or Ex-Partner:   . Emotionally Abused:   Marland Kitchen Physically Abused:   . Sexually Abused:      Current Outpatient Medications:  .  b complex vitamins tablet, Take 1 tablet by mouth daily., Disp: , Rfl:  .  Biotin w/ Vitamins C & E (HAIR/SKIN/NAILS PO), Take by mouth daily., Disp: , Rfl:  .  Cholecalciferol (VITAMIN D PO), Take by mouth daily., Disp: , Rfl:  .  levothyroxine (SYNTHROID, LEVOTHROID) 75 MCG tablet, Take 75 mcg by mouth daily before breakfast., Disp: , Rfl:  .  venlafaxine XR (EFFEXOR-XR) 75 MG 24 hr capsule, Take 75 mg by mouth daily with breakfast., Disp: , Rfl:  .  ALPRAZolam (XANAX) 0.25 MG tablet, Take 1 tablet (0.25 mg total) by mouth 2 (two) times daily as needed for anxiety., Disp: 60 tablet, Rfl: 0   Allergies  Allergen Reactions  . Sulfa Antibiotics Anaphylaxis  . Aspirin     Reaction unknown  . Codeine Itching    (all opiates)    ROS Review of Systems  Constitutional: Negative.   HENT: Negative.   Eyes: Negative.   Respiratory: Negative.   Cardiovascular: Negative.  Negative for chest pain and leg swelling.  Gastrointestinal: Negative.  Negative for abdominal pain.  Endocrine: Negative.   Genitourinary: Negative.  Negative for dysuria, frequency, hematuria and urgency.  Musculoskeletal: Positive for arthralgias (knee).  Skin: Negative.   Allergic/Immunologic: Negative.   Neurological: Negative.   Hematological: Negative.   Psychiatric/Behavioral: Negative.   All other systems reviewed and are negative.     OBJECTIVE:    Physical Exam Vitals reviewed.  Constitutional:      Appearance: Normal appearance.  HENT:     Mouth/Throat:     Mouth: Mucous membranes are moist.  Eyes:     Pupils: Pupils are equal, round, and reactive to light.  Cardiovascular:     Rate and Rhythm: Normal rate and regular rhythm.     Pulses: Normal pulses.     Heart sounds: Normal heart sounds.  Pulmonary:     Effort: Pulmonary effort is normal.     Breath  sounds: Normal breath sounds.  Abdominal:     Palpations: Abdomen is soft. There is no hepatomegaly, splenomegaly or mass.     Tenderness: There is no abdominal tenderness.  Musculoskeletal:     Right lower leg: No edema.     Left lower leg: No edema.  Skin:  Neurological:     Mental Status: She is alert and oriented to person, place, and time.  Psychiatric:        Mood and Affect: Mood and affect normal.        Behavior: Behavior normal.     BP 117/74   Pulse 72   Ht 5\' 5"  (1.651 m)   Wt 150 lb 4.8 oz (68.2 kg)   BMI 25.01 kg/m  Wt Readings from Last 3 Encounters:  06/01/20 150 lb 4.8 oz (68.2 kg)  01/04/17 165 lb (74.8 kg)  09/20/14 159 lb 8 oz (72.3 kg)    Health Maintenance Due  Topic Date Due  . COVID-19 Vaccine (1) Never done  . DEXA SCAN  Never done  . PNA vac Low Risk Adult (1 of 2 - PCV13) Never done    There are no preventive care reminders to display for this patient.  CBC Latest Ref Rng & Units 09/21/2014 09/20/2014 09/10/2014  WBC 4.0 - 10.5 K/uL 15.9(H) 11.4(H) 8.7  Hemoglobin 12.0 - 15.0 g/dL 9.8(L) 11.6(L) 13.6  Hematocrit 36 - 46 % 30.2(L) 35.1(L) 40.9  Platelets 150 - 400 K/uL 211 225 249   CMP Latest Ref Rng & Units 09/21/2014 09/20/2014 09/10/2014  Glucose 70 - 99 mg/dL 155(H) - 92  BUN 6 - 23 mg/dL 12 - 14  Creatinine 0.50 - 1.10 mg/dL 0.64 0.72 0.72  Sodium 137 - 147 mEq/L 141 - 141  Potassium 3.7 - 5.3 mEq/L 4.4 - 3.9  Chloride 96 - 112 mEq/L 102 - 101  CO2 19 - 32 mEq/L 28 - 28  Calcium 8.4 - 10.5 mg/dL 8.8 - 9.9  Total Protein 6.0 - 8.3 g/dL - - 7.6  Total Bilirubin 0.3 - 1.2 mg/dL - - 0.5  Alkaline Phos 39 - 117 U/L - - 82  AST 0 - 37 U/L - - 20  ALT 0 - 35 U/L - - 19    No results found for: TSH Lab Results  Component Value Date   ALBUMIN 4.3 09/10/2014   ANIONGAP 11 09/21/2014   No results found for: CHOL, HDL, LDLCALC, CHOLHDL No results found for: TRIG No results found for: HGBA1C    ASSESSMENT & PLAN:   Problem  List Items Addressed This Visit      Endocrine   Hypothyroidism    Stable.        Musculoskeletal and Integument   DJD (degenerative joint disease) of knee    Patient is having severe degenerative arthritis of the right knee her knee is bone-on-bone.  She is going to have surgery for the right knee.  He is stable at the present time for surgery is approved.      Knee arthropathy     Other   Hypercholesteremia    Stable.      Pre-op exam    She is approved for the right knee surgery      Relevant Orders   EKG 12-Lead   Anxiety - Primary    Patient has chronic anxiety with insomnia I gave her a prescription of Xanax      Relevant Medications   ALPRAZolam (XANAX) 0.25 MG tablet      Meds ordered this encounter  Medications  . ALPRAZolam (XANAX) 0.25 MG tablet    Sig: Take 1 tablet (0.25 mg total) by mouth 2 (two) times daily as needed for anxiety.    Dispense:  60 tablet    Refill:  0   1. Pre-op  exam Patient is approved for the right knee surgery. - EKG 12-Lead revealed normal sinus rhythm with a heart rate of 69.  Left atrial enlargement is noted.  There is poor R wave progression V1 V2 V3.  Small Q waves noted in lead III.  Nonspecific ST-T abnormalities are noted.  ekg doesn't show any acute changes.  2. Hypothyroidism due to Hashimoto's thyroiditis Stable.  3. Anxiety She was given a prescription for Xanax - ALPRAZolam (XANAX) 0.25 MG tablet; Take 1 tablet (0.25 mg total) by mouth 2 (two) times daily as needed for anxiety.  Dispense: 60 tablet; Refill: 0  4. Knee arthropathy   5. Primary osteoarthritis of right knee  Patient has a left knee surgery  In past     she is having problems with her right knee and she is scheduled to have surgery i don't feel any contraindication for surgery.   She is approved for right knee replacement. 6. Hypercholesteremia stable  Follow-up: No follow-ups on file.    Dr. Jane Canary [facility] [address], [place], [place] [address]   By signing my name below, I, [employer], attest that this documentation has been prepared under the direction and in the presence of Cletis Athens, MD. Electronically Signed: Cletis Athens, MD 06/01/20, 10:49 AM   I personally performed the services described in this documentation, which was SCRIBED in my presence. The recorded information has been reviewed and considered accurate. It has been edited as necessary during review. Cletis Athens, MD

## 2020-06-01 NOTE — Assessment & Plan Note (Signed)
She is approved for the right knee surgery

## 2020-06-03 ENCOUNTER — Encounter: Payer: Self-pay | Admitting: Cardiology

## 2020-06-06 NOTE — Addendum Note (Signed)
Addended by: Cletis Athens on: 06/06/2020 04:48 PM   Modules accepted: Level of Service

## 2020-06-29 ENCOUNTER — Ambulatory Visit: Payer: PPO | Admitting: Dermatology

## 2020-06-29 ENCOUNTER — Other Ambulatory Visit: Payer: Self-pay

## 2020-06-29 ENCOUNTER — Encounter: Payer: Self-pay | Admitting: Dermatology

## 2020-06-29 DIAGNOSIS — L080 Pyoderma: Secondary | ICD-10-CM | POA: Diagnosis not present

## 2020-06-29 DIAGNOSIS — D492 Neoplasm of unspecified behavior of bone, soft tissue, and skin: Secondary | ICD-10-CM

## 2020-06-29 DIAGNOSIS — D485 Neoplasm of uncertain behavior of skin: Secondary | ICD-10-CM | POA: Diagnosis not present

## 2020-06-29 MED ORDER — DOXYCYCLINE HYCLATE 100 MG PO TABS: 100.0000 mg | ORAL_TABLET | Freq: Two times a day (BID) | ORAL | 0 refills | Status: AC

## 2020-06-29 MED ORDER — MUPIROCIN 2 % EX OINT: TOPICAL_OINTMENT | CUTANEOUS | 0 refills | Status: AC

## 2020-06-29 NOTE — Patient Instructions (Signed)

## 2020-06-29 NOTE — Progress Notes (Signed)
   Follow-Up Visit   Subjective  Shelley Sherman is a 79 y.o. female who presents for the following: ingrown hair (Pt c/o ingrown hair on her right arm x 2 weeks red and irritating ).  The following portions of the chart were reviewed this encounter and updated as appropriate:  Tobacco  Allergies  Meds  Problems  Med Hx  Surg Hx  Fam Hx     Review of Systems:  No other skin or systemic complaints except as noted in HPI or Assessment and Plan.  Objective  Well appearing patient in no apparent distress; mood and affect are within normal limits.  A focused examination was performed including Right arm . Relevant physical exam findings are noted in the Assessment and Plan.  Objective  Right Forearm - Anterior: 1.2 cm soft nodule    Assessment & Plan    Neoplasm of skin Right Forearm - Anterior  Epidermal / dermal shaving  Lesion diameter (cm):  1.2 Informed consent: discussed and consent obtained   Timeout: patient name, date of birth, surgical site, and procedure verified   Procedure prep:  Patient was prepped and draped in usual sterile fashion Prep type:  Isopropyl alcohol Anesthesia: the lesion was anesthetized in a standard fashion   Anesthetic:  1% lidocaine w/ epinephrine 1-100,000 buffered w/ 8.4% NaHCO3 Hemostasis achieved with: pressure, aluminum chloride and electrodesiccation   Outcome: patient tolerated procedure well   Post-procedure details: sterile dressing applied and wound care instructions given   Dressing type: bandage and petrolatum    Destruction of lesion Complexity: extensive   Destruction method: electrodesiccation and curettage   Informed consent: discussed and consent obtained   Timeout:  patient name, date of birth, surgical site, and procedure verified Procedure prep:  Patient was prepped and draped in usual sterile fashion Prep type:  Isopropyl alcohol Anesthesia: the lesion was anesthetized in a standard fashion   Anesthetic:  1%  lidocaine w/ epinephrine 1-100,000 buffered w/ 8.4% NaHCO3 Curettage performed in three different directions: Yes   Electrodesiccation performed over the curetted area: Yes   Lesion length (cm):  1.2 Margin per side (cm):  0.2 Final wound size (cm):  1.6 Hemostasis achieved with:  pressure, aluminum chloride and electrodesiccation Outcome: patient tolerated procedure well with no complications   Post-procedure details: sterile dressing applied and wound care instructions given   Dressing type: bandage and petrolatum   Additional details:  Post defect 1.6 cm   Specimen 1 - Surgical pathology Differential Diagnosis: SCC vs Furunculosis  Check Margins: No 1.2 cm soft nodule  Ordered Medications: doxycycline (VIBRA-TABS) 100 MG tablet mupirocin ointment (BACTROBAN) 2 %  Return in about 2 months (around 08/30/2020). IMarye Round, CMA, am acting as scribe for Sarina Ser, MD .  Documentation: I have reviewed the above documentation for accuracy and completeness, and I agree with the above.  Sarina Ser, MD

## 2020-07-03 ENCOUNTER — Encounter: Payer: Self-pay | Admitting: Dermatology

## 2020-07-04 ENCOUNTER — Telehealth: Payer: Self-pay

## 2020-07-04 NOTE — Telephone Encounter (Signed)
-----   Message from Ralene Bathe, MD sent at 06/30/2020  5:30 PM EDT ----- Skin , right forearm anterior SUPPURATIVE GRANULOMATOUS DERMATITIS  Benign - most consistent with inflamed ruptured cyst or "boil" Continue current recommended treatment

## 2020-07-04 NOTE — Telephone Encounter (Signed)
Patient informed of pathology results 

## 2020-08-24 ENCOUNTER — Ambulatory Visit (INDEPENDENT_AMBULATORY_CARE_PROVIDER_SITE_OTHER): Payer: PPO | Admitting: Internal Medicine

## 2020-08-24 ENCOUNTER — Other Ambulatory Visit: Payer: Self-pay

## 2020-08-24 ENCOUNTER — Encounter: Payer: Self-pay | Admitting: Internal Medicine

## 2020-08-24 VITALS — BP 129/80 | HR 73 | Ht 65.0 in | Wt 150.0 lb

## 2020-08-24 DIAGNOSIS — E7800 Pure hypercholesterolemia, unspecified: Secondary | ICD-10-CM | POA: Diagnosis not present

## 2020-08-24 DIAGNOSIS — E038 Other specified hypothyroidism: Secondary | ICD-10-CM | POA: Diagnosis not present

## 2020-08-24 DIAGNOSIS — F419 Anxiety disorder, unspecified: Secondary | ICD-10-CM

## 2020-08-24 DIAGNOSIS — E063 Autoimmune thyroiditis: Secondary | ICD-10-CM

## 2020-08-24 DIAGNOSIS — M1711 Unilateral primary osteoarthritis, right knee: Secondary | ICD-10-CM | POA: Diagnosis not present

## 2020-08-24 NOTE — Assessment & Plan Note (Signed)
Pt has left knee surgery and she is doing well on that. Right knee is a little more swollen and giving her more trouble. She wants to see an orthopedic surgeon for that when the [place] epidemic subsides.

## 2020-08-24 NOTE — Assessment & Plan Note (Signed)
-   Patient experiencing high levels of anxiety.  - Encouraged patient to engage in relaxing activities like yoga, meditation, journaling, going for a walk, or participating in a hobby.  - Encouraged patient to reach out to trusted friends or family members about recent struggles 

## 2020-08-24 NOTE — Assessment & Plan Note (Signed)
Pt is only using diet to bring her cholesterol under control.

## 2020-08-24 NOTE — Assessment & Plan Note (Deleted)
Hypothyroidism stable at the present time.

## 2020-08-24 NOTE — Assessment & Plan Note (Signed)
Hypothyroidism stable at the present time.

## 2020-08-24 NOTE — Progress Notes (Signed)
Established Patient Office Visit  SUBJECTIVE:  Subjective  Patient ID: Shelley Sherman, female    DOB: [birth year]  Age: 79 y.o. MRN: [id number]  CC:  Chief Complaint  Patient presents with  . Follow-up    patient is moving and just wanted a final visit before leaving    HPI Shelley Sherman is a 79 y.o. female presenting today for a routine follow up prior to moving.  She is moving to [facility] with her son and wanted a final check up before she moved and had to find a new provider. She notes that she is divorcing from her husband, which is the reason for her moving. She does not have a support system set up where she is moving, but having her son with her will be beneficial.   Her other son will remain here since he works at [employer] in the ED.    Past Medical History:  Diagnosis Date  . Anxiety    takes Xanax nightly  . Cataracts, bilateral   . Depression 09/08/2014   takes Effexor daily  . Dizziness    in May 2015  . H/O tuberculosis    Possible. Lung xrays showed scarring which Dr said could have been TB as child.  Marland Kitchen Headache    sinus  . History of colon polyps   . History of migraine    last one has been over a yr ago  . Hypercholesteremia    takes Lovastatin daily  . Hypothyroidism    takes Synthroid daily  . Joint pain   . Macular degeneration, dry   . Neck pain   . Peripheral neuropathy   . PONV (postoperative nausea and vomiting)    slow to wake up  . Primary localized osteoarthritis of left knee   . Urinary urgency     Past Surgical History:  Procedure Laterality Date  . CHOLECYSTECTOMY  1984  . COLONOSCOPY    . COLONOSCOPY WITH PROPOFOL N/A 01/04/2017   Procedure: COLONOSCOPY WITH PROPOFOL;  Surgeon: Lucilla Lame, MD;  Location: [facility];  Service: Endoscopy;  Laterality: N/A;  . [place]  . KNEE ARTHROSCOPY W/ MENISCECTOMY Left 2008  . OVARIAN CYST REMOVAL  1972  . POLYPECTOMY  01/04/2017   Procedure:  POLYPECTOMY;  Surgeon: Lucilla Lame, MD;  Location: [facility];  Service: Endoscopy;;  . TONSILECTOMY, ADENOIDECTOMY, BILATERAL MYRINGOTOMY AND TUBES  1947  . 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 Seizures Sister   . Breast cancer Paternal Grandmother   . Breast cancer Cousin   . Breast cancer Cousin     Social History   Socioeconomic History  . Marital status: Married    Spouse name: Not on file  . Number of children: Not on file  . Years of education: Not on file  . Highest education level: Not on file  Occupational History  . Not on file  Tobacco Use  . Smoking status: Former Smoker    Packs/day: 0.25    Years: 30.00    Pack years: 7.50    Types: Cigarettes    Quit date: 04/16/2020    Years since quitting: 0.3  . Smokeless tobacco: Never Used  Substance and Sexual Activity  . Alcohol  use: No  . Drug use: Yes    Types: Marijuana    Comment: last week  . Sexual activity: Yes  Other Topics Concern  . Not on file  Social History Narrative  . Not on file   Social Determinants of Health   Financial Resource Strain:   . Difficulty of Paying Living Expenses: Not on file  Food Insecurity:   . Worried About Charity fundraiser in the Last Year: Not on file  . Ran Out of Food in the Last Year: Not on file  Transportation Needs:   . Lack of Transportation (Medical): Not on file  . Lack of Transportation (Non-Medical): Not on file  Physical Activity:   . Days of Exercise per Week: Not on file  . Minutes of Exercise per Session: Not on file  Stress:   . Feeling of Stress : Not on file  Social Connections:   . Frequency of Communication with Friends and Family: Not on file  . Frequency of Social Gatherings with Friends and Family: Not on file  .  Attends Religious Services: Not on file  . Active Member of Clubs or Organizations: Not on file  . Attends Archivist Meetings: Not on file  . Marital Status: Not on file  Intimate Partner Violence:   . Fear of Current or Ex-Partner: Not on file  . Emotionally Abused: Not on file  . Physically Abused: Not on file  . Sexually Abused: Not on file     Current Outpatient Medications:  .  b complex vitamins tablet, Take 1 tablet by mouth daily., Disp: , Rfl:  .  Biotin w/ Vitamins C & E (HAIR/SKIN/NAILS PO), Take by mouth daily., Disp: , Rfl:  .  Cholecalciferol (VITAMIN D PO), Take by mouth daily., Disp: , Rfl:  .  levothyroxine (SYNTHROID, LEVOTHROID) 75 MCG tablet, Take 75 mcg by mouth daily before breakfast., Disp: , Rfl:  .  mupirocin ointment (BACTROBAN) 2 %, Apply to skin qd-bid, Disp: 22 g, Rfl: 0 .  venlafaxine XR (EFFEXOR-XR) 75 MG 24 hr capsule, Take 75 mg by mouth daily with breakfast., Disp: , Rfl:    Allergies  Allergen Reactions  . Sulfa Antibiotics Anaphylaxis  . Aspirin     Reaction unknown  . Codeine Itching    (all opiates)    ROS Review of Systems  Constitutional: Negative.   HENT: Negative.   Eyes: Negative.   Respiratory: Negative.   Cardiovascular: Negative.   Gastrointestinal: Negative.   Endocrine: Negative.   Genitourinary: Negative.   Musculoskeletal: Negative.   Skin: Negative.   Allergic/Immunologic: Negative.   Neurological: Negative.   Hematological: Negative.   Psychiatric/Behavioral: Negative for self-injury and suicidal ideas. The patient is nervous/anxious.   All other systems reviewed and are negative.    OBJECTIVE:    Physical Exam Vitals reviewed.  Constitutional:      Appearance: Normal appearance.  HENT:     Mouth/Throat:     Mouth: Mucous membranes are moist.  Eyes:     Pupils: Pupils are equal, round, and reactive to light.  Neck:     Vascular: No carotid bruit.  Cardiovascular:     Rate and Rhythm: Normal  rate and regular rhythm.     Pulses: Normal pulses.     Heart sounds: Normal heart sounds.  Pulmonary:     Effort: Pulmonary effort is normal.     Breath sounds: Normal breath sounds.  Abdominal:     General: Bowel sounds  are normal.     Palpations: Abdomen is soft. There is no hepatomegaly, splenomegaly or mass.     Tenderness: There is no abdominal tenderness.     Hernia: No hernia is present.  Musculoskeletal:        General: No tenderness.     Cervical back: Neck supple.     Right lower leg: No edema.     Left lower leg: No edema.  Skin:    Findings: No rash.  Neurological:     Mental Status: She is alert and oriented to person, place, and time.     Motor: No weakness.  Psychiatric:        Mood and Affect: Mood and affect normal.        Behavior: Behavior normal.     BP 129/80   Pulse 73   Ht 5\' 5"  (1.651 m)   Wt 150 lb (68 kg)   BMI 24.96 kg/m  Wt Readings from Last 3 Encounters:  08/24/20 150 lb (68 kg)  06/01/20 150 lb 4.8 oz (68.2 kg)  01/04/17 165 lb (74.8 kg)    Health Maintenance Due  Topic Date Due  . COVID-19 Vaccine (1) Never done  . DEXA SCAN  Never done  . PNA vac Low Risk Adult (1 of 2 - PCV13) Never done  . INFLUENZA VACCINE  07/17/2020    There are no preventive care reminders to display for this patient.  CBC Latest Ref Rng & Units 09/21/2014 09/20/2014 09/10/2014  WBC 4.0 - 10.5 K/uL 15.9(H) 11.4(H) 8.7  Hemoglobin 12.0 - 15.0 g/dL 9.8(L) 11.6(L) 13.6  Hematocrit 36 - 46 % 30.2(L) 35.1(L) 40.9  Platelets 150 - 400 K/uL 211 225 249   CMP Latest Ref Rng & Units 09/21/2014 09/20/2014 09/10/2014  Glucose 70 - 99 mg/dL 155(H) - 92  BUN 6 - 23 mg/dL 12 - 14  Creatinine 0.50 - 1.10 mg/dL 0.64 0.72 0.72  Sodium 137 - 147 mEq/L 141 - 141  Potassium 3.7 - 5.3 mEq/L 4.4 - 3.9  Chloride 96 - 112 mEq/L 102 - 101  CO2 19 - 32 mEq/L 28 - 28  Calcium 8.4 - 10.5 mg/dL 8.8 - 9.9  Total Protein 6.0 - 8.3 g/dL - - 7.6  Total Bilirubin 0.3 - 1.2 mg/dL - -  0.5  Alkaline Phos 39 - 117 U/L - - 82  AST 0 - 37 U/L - - 20  ALT 0 - 35 U/L - - 19    No results found for: TSH Lab Results  Component Value Date   ALBUMIN 4.3 09/10/2014   ANIONGAP 11 09/21/2014   No results found for: CHOL, HDL, LDLCALC, CHOLHDL No results found for: TRIG No results found for: HGBA1C    ASSESSMENT & PLAN:   Problem List Items Addressed This Visit      Endocrine   Hypothyroidism    Hypothyroidism stable at the present time.         Musculoskeletal and Integument   Primary osteoarthritis of right knee - Primary    Pt has left knee surgery and she is doing well on that. Right knee is a little more swollen and giving her more trouble. She wants to see an orthopedic surgeon for that when the [place] epidemic subsides.         Other   Hypercholesteremia    Pt is only using diet to bring her cholesterol under control.       Anxiety    - Patient  experiencing high levels of anxiety.  - Encouraged patient to engage in relaxing activities like yoga, meditation, journaling, going for a walk, or participating in a hobby.  - Encouraged patient to reach out to trusted friends or family members about recent struggles         No orders of the defined types were placed in this encounter.   Follow-up: No follow-ups on file.    Cletis Athens, MD [facility] [address] [place], [place] [address]   By signing my name below, I, [employer], attest that this documentation has been prepared under the direction and in the presence of Dr. Cletis Athens . Electronically Signed: Cletis Athens, MD 08/24/20, 12:07 PM   I personally performed the services described in this documentation, which was SCRIBED in my presence. The recorded information has been reviewed and considered accurate. It has been edited as necessary during review. Cletis Athens, MD

## 2020-09-08 ENCOUNTER — Ambulatory Visit: Payer: PPO | Admitting: Dermatology

## 2020-11-24 ENCOUNTER — Other Ambulatory Visit: Payer: Self-pay | Admitting: *Deleted

## 2020-11-24 MED ORDER — LEVOTHYROXINE SODIUM 75 MCG PO TABS: 75.0000 ug | ORAL_TABLET | Freq: Every day | ORAL | 1 refills | Status: DC

## 2020-11-24 MED ORDER — VENLAFAXINE HCL ER 75 MG PO CP24
75.0000 mg | ORAL_CAPSULE | Freq: Every day | ORAL | 1 refills | Status: DC
Start: 2020-11-24 — End: 2021-05-25

## 2021-03-13 ENCOUNTER — Other Ambulatory Visit: Payer: Self-pay

## 2021-03-13 ENCOUNTER — Ambulatory Visit: Payer: Medicare Other | Admitting: Dermatology

## 2021-03-13 DIAGNOSIS — D485 Neoplasm of uncertain behavior of skin: Secondary | ICD-10-CM

## 2021-03-13 DIAGNOSIS — B353 Tinea pedis: Secondary | ICD-10-CM

## 2021-03-13 MED ORDER — ECONAZOLE NITRATE 1 % EX CREA: TOPICAL_CREAM | CUTANEOUS | 1 refills | Status: AC

## 2021-03-13 NOTE — Progress Notes (Signed)
   Follow-Up Visit   Subjective  Shelley Sherman is a 80 y.o. female who presents for the following: Lesion (Left upper lip, present for years. Has been treated with cryotherapy a couple of times in the past. Area will clear, but always comes back in the same area.). Also needs rf for athlete's foot cream.   The following portions of the chart were reviewed this encounter and updated as appropriate:       Review of Systems:  No other skin or systemic complaints except as noted in HPI or Assessment and Plan.  Objective  Well appearing patient in no apparent distress; mood and affect are within normal limits.  A focused examination was performed including face. Relevant physical exam findings are noted in the Assessment and Plan.  Objective  Left Upper [place] Lip: 5.14mm pink papule with central ulceration     Objective  feet: Mild erythema and scale of the lateral and plantar foot.   Assessment & Plan  Neoplasm of uncertain behavior of skin Left Upper [place] Lip  Skin / nail biopsy Type of biopsy: tangential   Informed consent: discussed and consent obtained   Patient was prepped and draped in usual sterile fashion: Area prepped with alcohol. Anesthesia: the lesion was anesthetized in a standard fashion   Anesthetic:  1% lidocaine w/ epinephrine 1-100,000 buffered w/ 8.4% NaHCO3 Instrument used: flexible razor blade   Hemostasis achieved with: pressure, aluminum chloride and electrodesiccation   Outcome: patient tolerated procedure well    Destruction of lesion  Destruction method: electrodesiccation and curettage   Informed consent: discussed and consent obtained   Timeout:  patient name, date of birth, surgical site, and procedure verified Curettage performed in three different directions: Yes   Electrodesiccation performed over the curetted area: Yes   Lesion length (cm):  0.6 Lesion width (cm):  0.6 Margin per side (cm):  0 Final wound size (cm):   0.6 Hemostasis achieved with:  pressure, aluminum chloride and electrodesiccation Outcome: patient tolerated procedure well with no complications   Post-procedure details: wound care instructions given   Additional details:  Mupirocin ointment and Bandaid applied    Specimen 1 - Surgical pathology Differential Diagnosis: r/o BCC Check Margins: No 5.88mm pink papule with central ulceration EDC today  Discussed EDC today vs waiting for pathology results. Patient prefers to have [place] done today at the same time of biopsy.   EDC would leave a round depressed whitish scar about the same size as the original lesion.  It is treated here in office in a procedure we call "scrape and burn". Mid to high 80% cure rate.      Tinea pedis of right foot feet  Restart Econazole Cream Apply to feet BID until rash improved, then continue for an additional week dsp 85g 1Rf.  Zeasorb AF powder inside socks and AmLactin cream daily to feet.   econazole nitrate 1 % cream - feet  Return in about 6 months (around 09/13/2021).  IJamesetta Orleans, CMA, am acting as scribe for Brendolyn Patty, MD .  Documentation: I have reviewed the above documentation for accuracy and completeness, and I agree with the above.  Brendolyn Patty MD

## 2021-03-13 NOTE — Patient Instructions (Addendum)
Zeasorb AF Powder - Apply to feet daily.   Wound Care Instructions  1. Cleanse wound gently with soap and water once a day then pat dry with clean gauze. Apply a thing coat of Petrolatum (petroleum jelly, "Vaseline") over the wound (unless you have an allergy to this). We recommend that you use a new, sterile tube of Vaseline. Do not pick or remove scabs. Do not remove the yellow or white "healing tissue" from the base of the wound.  2. Cover the wound with fresh, clean, nonstick gauze and secure with paper tape. You may use Band-Aids in place of gauze and tape if the would is small enough, but would recommend trimming much of the tape off as there is often too much. Sometimes Band-Aids can irritate the skin.  3. You should call the office for your biopsy report after 1 week if you have not already been contacted.  4. If you experience any problems, such as abnormal amounts of bleeding, swelling, significant bruising, significant pain, or evidence of infection, please call the office immediately.  5. FOR ADULT SURGERY PATIENTS: If you need something for pain relief you may take 1 extra strength Tylenol (acetaminophen) AND 2 Ibuprofen (200mg  each) together every 4 hours as needed for pain. (do not take these if you are allergic to them or if you have a reason you should not take them.) Typically, you may only need pain medication for 1 to 3 days.    Electrodesiccation and Curettage ("Scrape and Burn") Wound Care Instructions  6. Leave the original bandage on for 24 hours if possible.  If the bandage becomes soaked or soiled before that time, it is OK to remove it and examine the wound.  A small amount of post-operative bleeding is normal.  If excessive bleeding occurs, remove the bandage, place gauze over the site and apply continuous pressure (no peeking) over the area for 30 minutes. If this does not work, please call our clinic as soon as possible or page your doctor if it is after hours.    7. Once a day, cleanse the wound with soap and water. It is fine to shower. If a thick crust develops you may use a Q-tip dipped into dilute hydrogen peroxide (mix 1:1 with water) to dissolve it.  Hydrogen peroxide can slow the healing process, so use it only as needed.    8. After washing, apply petroleum jelly (Vaseline) or an antibiotic ointment if your doctor prescribed one for you, followed by a bandage.    9. For best healing, the wound should be covered with a layer of ointment at all times. If you are not able to keep the area covered with a bandage to hold the ointment in place, this may mean re-applying the ointment several times a day.  Continue this wound care until the wound has healed and is no longer open. It may take several weeks for the wound to heal and close.  Itching and mild discomfort is normal during the healing process.  If you have any discomfort, you can take Tylenol (acetaminophen) or ibuprofen as directed on the bottle. (Please do not take these if you have an allergy to them or cannot take them for another reason).  Some redness, tenderness and white or yellow material in the wound is normal healing.  If the area becomes very sore and red, or develops a thick yellow-green material (pus), it may be infected; please notify us.    Wound healing continues for up  to one year following surgery. It is not unusual to experience pain in the scar from time to time during the interval.  If the pain becomes severe or the scar thickens, you should notify the office.    A slight amount of redness in a scar is expected for the first six months.  After six months, the redness will fade and the scar will soften and fade.  The color difference becomes less noticeable with time.  If there are any problems, return for a post-op surgery check at your earliest convenience.  To improve the appearance of the scar, you can use silicone scar gel, cream, or sheets (such as Mederma or Serica)  every night for up to one year. These are available over the counter (without a prescription).  Please call our office at [phone] for any questions or concerns.   If you have any questions or concerns for your doctor, please call our main line at [phone] and press option 4 to reach your doctor's medical assistant. If no one answers, please leave a voicemail as directed and we will return your call as soon as possible. Messages left after 4 pm will be answered the following business day.   You may also send [place] a message via [place]. We typically respond to MyChart messages within 1-2 business days.  For prescription refills, please ask your pharmacy to contact our office. Our fax number is [phone].  If you have an urgent issue when the clinic is closed that cannot wait until the next business day, you can page your doctor at the number below.    Please note that while we do our best to be available for urgent issues outside of office hours, we are not available 24/7.   If you have an urgent issue and are unable to reach [place], you may choose to seek medical care at your doctor's office, retail clinic, urgent care center, or emergency room.  If you have a medical emergency, please immediately call 911 or go to the emergency department.  Pager Numbers  - Dr. Nehemiah Massed: [phone]  - Dr. Laurence Ferrari: [phone]  - Dr. Nicole Kindred: [phone]  In the event of inclement weather, please call our main line at [phone] for an update on the status of any delays or closures.  Dermatology Medication Tips: Please keep the boxes that topical medications come in in order to help keep track of the instructions about where and how to use these. Pharmacies typically print the medication instructions only on the boxes and not directly on the medication tubes.   If your medication is too expensive, please contact our office at [phone] option 4 or send [place] a message through [place].   We  are unable to tell what your co-pay for medications will be in advance as this is different depending on your insurance coverage. However, we may be able to find a substitute medication at lower cost or fill out paperwork to get insurance to cover a needed medication.   If a prior authorization is required to get your medication covered by your insurance company, please allow [place] 1-2 business days to complete this process.  Drug prices often vary depending on where the prescription is filled and some pharmacies may offer cheaper prices.  The website www.goodrx.com contains coupons for medications through different pharmacies. The prices here do not account for what the cost may be with help from insurance (it may be cheaper with your insurance), but the website can give you the  price if you did not use any insurance.  - You can print the associated coupon and take it with your prescription to the pharmacy.  - You may also stop by our office during regular business hours and pick up a GoodRx coupon card.  - If you need your prescription sent electronically to a different pharmacy, notify our office through [facility] or by phone at [phone] option 4.

## 2021-03-20 ENCOUNTER — Telehealth: Payer: Self-pay

## 2021-03-20 MED ORDER — MOMETASONE FUROATE 0.1 % EX SOLN: CUTANEOUS | 0 refills | Status: DC

## 2021-03-20 NOTE — Telephone Encounter (Signed)
Patient was made aware of her biopsy results and scheduled 2 month follow up.  Patient states she needs a refill of her Mometasone Solution to applies to her scalp for itchiness and rough bumps. Patient states the RX came from Dr. Nehemiah Massed over a year ago. OK to RF? Pharmacy: [facility]

## 2021-03-20 NOTE — Telephone Encounter (Signed)
Yes can send in a rf mometasone solution/lotion qd/bid to [facility] scalp prn

## 2021-05-09 ENCOUNTER — Ambulatory Visit: Payer: Medicare Other | Admitting: Dermatology

## 2021-05-24 ENCOUNTER — Other Ambulatory Visit: Payer: Self-pay | Admitting: Internal Medicine

## 2021-05-31 ENCOUNTER — Encounter: Payer: Self-pay | Admitting: Dermatology

## 2021-05-31 ENCOUNTER — Other Ambulatory Visit: Payer: Self-pay

## 2021-05-31 ENCOUNTER — Ambulatory Visit: Payer: Medicare Other | Admitting: Dermatology

## 2021-05-31 DIAGNOSIS — L570 Actinic keratosis: Secondary | ICD-10-CM | POA: Diagnosis not present

## 2021-05-31 DIAGNOSIS — D1800 Hemangioma unspecified site: Secondary | ICD-10-CM

## 2021-05-31 DIAGNOSIS — Z872 Personal history of diseases of the skin and subcutaneous tissue: Secondary | ICD-10-CM | POA: Diagnosis not present

## 2021-05-31 DIAGNOSIS — L739 Follicular disorder, unspecified: Secondary | ICD-10-CM

## 2021-05-31 DIAGNOSIS — L820 Inflamed seborrheic keratosis: Secondary | ICD-10-CM

## 2021-05-31 DIAGNOSIS — L719 Rosacea, unspecified: Secondary | ICD-10-CM

## 2021-05-31 DIAGNOSIS — L578 Other skin changes due to chronic exposure to nonionizing radiation: Secondary | ICD-10-CM | POA: Diagnosis not present

## 2021-05-31 DIAGNOSIS — L821 Other seborrheic keratosis: Secondary | ICD-10-CM

## 2021-05-31 MED ORDER — METRONIDAZOLE 0.75 % EX GEL
CUTANEOUS | 3 refills | Status: AC
Start: 2021-05-31 — End: ?

## 2021-05-31 MED ORDER — CICLOPIROX 1 % EX SHAM: MEDICATED_SHAMPOO | CUTANEOUS | 2 refills | Status: AC

## 2021-05-31 MED ORDER — MOMETASONE FUROATE 0.1 % EX SOLN: CUTANEOUS | 3 refills | Status: AC

## 2021-05-31 NOTE — Patient Instructions (Addendum)

## 2021-05-31 NOTE — Progress Notes (Signed)
Follow-Up Visit   Subjective  Shelley Sherman is a 80 y.o. 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 Area was treated with EDC at time of biopsy. She also has a similar spot that came up on her right upper lip she would like checked. She has a history of itchy bumps in the scalp, now spreading to her forehead/hairline. She uses mometasone solution to the scalp as needed. She also has a couple of itchy spots on her left popliteal.).    The following portions of the chart were reviewed this encounter and updated as appropriate:        Review of Systems:  No other skin or systemic complaints except as noted in HPI or Assessment and Plan.  Objective  Well appearing patient in no apparent distress; mood and affect are within normal limits.  A focused examination was performed including face, back, legs. Relevant physical exam findings are noted in the Assessment and Plan.  Left Upper Lip Well healed scar.  Right Upper Vermilion Lip x 1, L nasal dorsum x 2 (3) Pink scaly macules.  Left Popliteal x 3 (3) Erythematous keratotic or waxy stuck-on papule or plaque.   Scalp Excoriated papules of the scalp.  face Erythema and telangiectasias of the nose.   Assessment & Plan  Actinic Damage - chronic, secondary to cumulative UV radiation exposure/sun exposure over time - diffuse scaly erythematous macules with underlying dyspigmentation - Recommend daily broad spectrum sunscreen SPF 30+ to sun-exposed areas, reapply every 2 hours as needed.  - Recommend staying in the shade or wearing long sleeves, sun glasses (UVA+UVB protection) and wide brim hats (4-inch brim around the entire circumference of the hat). - Call for new or changing lesions.  Hemangiomas - Red papules - Discussed benign nature - Observe - Call for any changes  Seborrheic Keratoses - Stuck-on, waxy, tan-brown papules  and/or plaques, including infraocular  - Benign-appearing - Discussed benign etiology and prognosis. - Observe - Call for any changes   Lentigines - Scattered tan macules - Due to sun exposure - Benign-appering, observe - Recommend daily broad spectrum sunscreen SPF 30+ to sun-exposed areas, reapply every 2 hours as needed. - Call for any changes    History of actinic keratosis Left Upper Lip  Hypertrophic, biopsy proven and treated with EDC.  Clear. Observe for recurrence. Call clinic for new or changing lesions.  Recommend regular skin exams, daily broad-spectrum spf 30+ sunscreen use, and photoprotection.    AK (actinic keratosis) (3) Right Upper Vermilion Lip x 1, L nasal dorsum x 2  Actinic keratoses are precancerous spots that appear secondary to cumulative UV radiation exposure/sun exposure over time. They are chronic with expected duration over 1 year. A portion of actinic keratoses will progress to squamous cell carcinoma of the skin. It is not possible to reliably predict which spots will progress to skin cancer and so treatment is recommended to prevent development of skin cancer.  Recommend daily broad spectrum sunscreen SPF 30+ to sun-exposed areas, reapply every 2 hours as needed.  Recommend staying in the shade or wearing long sleeves, sun glasses (UVA+UVB protection) and wide brim hats (4-inch brim around the entire circumference of the hat). Call for new or changing lesions.  Destruction of lesion - Right Upper Vermilion Lip x 1, L nasal dorsum x 2  Destruction method: cryotherapy   Informed consent: discussed and consent obtained   Lesion destroyed  using liquid nitrogen: Yes   Region frozen until ice ball extended beyond lesion: Yes   Outcome: patient tolerated procedure well with no complications   Post-procedure details: wound care instructions given   Additional details:  Prior to procedure, discussed risks of blister formation, small wound, skin  dyspigmentation, or rare scar following cryotherapy. Recommend Vaseline ointment to treated areas while healing.   Inflamed seborrheic keratosis Left Popliteal x 3  Destruction of lesion - Left Popliteal x 3  Destruction method: cryotherapy   Informed consent: discussed and consent obtained   Lesion destroyed using liquid nitrogen: Yes   Region frozen until ice ball extended beyond lesion: Yes   Outcome: patient tolerated procedure well with no complications   Post-procedure details: wound care instructions given   Additional details:  Prior to procedure, discussed risks of blister formation, small wound, skin dyspigmentation, or rare scar following cryotherapy. Recommend Vaseline ointment to treated areas while healing.   Folliculitis Scalp  vs ISKs Chronic condition, bumps will come and go  Start ciclopirox shampoo Massage into scalp and let sit several minutes before rinsing dsp 159mL 2Rf.  Continue mometasone solution to AA scalp and hairline qd/bid prn itch.  Discussed chronic low dose doxycycline, but patient defers today.  Ciclopirox 1 % shampoo - Scalp Massage into scalp daily and let sit several minutes before rinsing.  Rosacea face  Rosacea is a chronic progressive skin condition usually affecting the face of adults, causing redness and/or acne bumps. It is treatable but not curable. It sometimes affects the eyes (ocular rosacea) as well. It may respond to topical and/or systemic medication and can flare with stress, sun exposure, alcohol, exercise and some foods.  Daily application of broad spectrum spf 30+ sunscreen to face is recommended to reduce flares.  Start metronidazole 0.75% gel Apply to nose and cheeks qd/bid dsp 45g 3Rf.  metroNIDAZOLE (METROGEL) 0.75 % gel - face Apply to cheeks and nose 1-2 times a day for rosacea.  Return as scheduled.  IJamesetta Orleans, CMA, am acting as scribe for Brendolyn Patty, MD . Documentation: I have reviewed the above  documentation for accuracy and completeness, and I agree with the above.  Brendolyn Patty MD

## 2021-09-12 ENCOUNTER — Ambulatory Visit: Payer: Medicare Other | Admitting: Dermatology

## 2021-09-12 ENCOUNTER — Other Ambulatory Visit: Payer: Self-pay

## 2021-09-12 DIAGNOSIS — L814 Other melanin hyperpigmentation: Secondary | ICD-10-CM

## 2021-09-12 DIAGNOSIS — L570 Actinic keratosis: Secondary | ICD-10-CM | POA: Diagnosis not present

## 2021-09-12 DIAGNOSIS — L853 Xerosis cutis: Secondary | ICD-10-CM | POA: Diagnosis not present

## 2021-09-12 DIAGNOSIS — L578 Other skin changes due to chronic exposure to nonionizing radiation: Secondary | ICD-10-CM | POA: Diagnosis not present

## 2021-09-12 DIAGNOSIS — Z1283 Encounter for screening for malignant neoplasm of skin: Secondary | ICD-10-CM | POA: Diagnosis not present

## 2021-09-12 DIAGNOSIS — L820 Inflamed seborrheic keratosis: Secondary | ICD-10-CM | POA: Diagnosis not present

## 2021-09-12 DIAGNOSIS — I8310 Varicose veins of unspecified lower extremity with inflammation: Secondary | ICD-10-CM

## 2021-09-12 DIAGNOSIS — L719 Rosacea, unspecified: Secondary | ICD-10-CM

## 2021-09-12 DIAGNOSIS — L821 Other seborrheic keratosis: Secondary | ICD-10-CM

## 2021-09-12 DIAGNOSIS — Z872 Personal history of diseases of the skin and subcutaneous tissue: Secondary | ICD-10-CM

## 2021-09-12 DIAGNOSIS — D1800 Hemangioma unspecified site: Secondary | ICD-10-CM

## 2021-09-12 NOTE — Progress Notes (Signed)
Follow-Up Visit   Subjective  Shelley Sherman is a 80 y.o. female who presents for the following: Follow-up (Patient here today for 6 month follow up. Patient states has a scaly itchy spot at left lower leg. Patient also reports a spot behind  knee she would like checked. ).  Patient here for full body skin exam and skin cancer screening.   The following portions of the chart were reviewed this encounter and updated as appropriate:      Review of Systems: No other skin or systemic complaints except as noted in HPI or Assessment and Plan.   Objective  Well appearing patient in no apparent distress; mood and affect are within normal limits.  A full examination was performed including scalp, head, eyes, ears, nose, lips, neck, chest, axillae, abdomen, back, buttocks, bilateral upper extremities, bilateral lower extremities, hands, feet, fingers, toes, fingernails, and toenails. All findings within normal limits unless otherwise noted below.  left pretibia x 1 Erythematous keratotic or waxy stuck-on papule   back Xerosis at back, pt c/o of itching  Head - Anterior (Face) Mild erythema central face with telangiectasias  left nasal tip x 2 (2) Erythematous thin papules/macules with gritty scale.   Assessment & Plan  Inflamed seborrheic keratosis left pretibia x 1  Irritated   Destruction of lesion - left pretibia x 1  Destruction method: cryotherapy   Informed consent: discussed and consent obtained   Lesion destroyed using liquid nitrogen: Yes   Region frozen until ice ball extended beyond lesion: Yes   Outcome: patient tolerated procedure well with no complications   Post-procedure details: wound care instructions given    Xerosis cutis back  With Pruritus Recommend mild soap and moisturizing cream 1-2 times daily.  Gentle skin care handout provided.     Rosacea Head - Anterior (Face)  Rosacea is a chronic progressive skin condition usually affecting the  face of adults, causing redness and/or acne bumps. It is treatable but not curable. It sometimes affects the eyes (ocular rosacea) as well. It may respond to topical and/or systemic medication and can flare with stress, sun exposure, alcohol, exercise and some foods.  Daily application of broad spectrum spf 30+ sunscreen to face is recommended to reduce flares.  Continue metronidazole 0.75 % gel as directed   Related Medications metroNIDAZOLE (METROGEL) 0.75 % gel Apply to cheeks and nose 1-2 times a day for rosacea.  Actinic keratosis (2) left nasal tip x 2  Actinic keratoses are precancerous spots that appear secondary to cumulative UV radiation exposure/sun exposure over time. They are chronic with expected duration over 1 year. A portion of actinic keratoses will progress to squamous cell carcinoma of the skin. It is not possible to reliably predict which spots will progress to skin cancer and so treatment is recommended to prevent development of skin cancer.  Recommend daily broad spectrum sunscreen SPF 30+ to sun-exposed areas, reapply every 2 hours as needed.  Recommend staying in the shade or wearing long sleeves, sun glasses (UVA+UVB protection) and wide brim hats (4-inch brim around the entire circumference of the hat). Call for new or changing lesions.  Destruction of lesion - left nasal tip x 2  Destruction method: cryotherapy   Informed consent: discussed and consent obtained   Lesion destroyed using liquid nitrogen: Yes   Region frozen until ice ball extended beyond lesion: Yes   Outcome: patient tolerated procedure well with no complications   Post-procedure details: wound care instructions given   Additional  details:  Prior to procedure, discussed risks of blister formation, small wound, skin dyspigmentation, or rare scar following cryotherapy. Recommend Vaseline ointment to treated areas while healing.   Lentigines - Scattered tan macules at back - Due to sun  exposure - Benign-appering, observe - Recommend daily broad spectrum sunscreen SPF 30+ to sun-exposed areas, reapply every 2 hours as needed. - Call for any changes  Hemangiomas - Red papules at back - Discussed benign nature - Observe - Call for any changes  Seborrheic Keratoses - Stuck-on, waxy, tan-brown papules and/or plaques  at legs , back, face, including L popliteal fossa - Benign-appearing - Discussed benign etiology and prognosis. - Observe - Call for any changes  Varicose Veins/Spider Veins - Dilated blue, purple or red veins at the lower extremities - Reassured - Smaller vessels can be treated by sclerotherapy (a procedure to inject a medicine into the veins to make them disappear) if desired, but the treatment is not covered by insurance. Larger vessels may be covered if symptomatic and we would refer to vascular surgeon if treatment desired.  History of PreCancerous Actinic Keratosis  - site(s) of PreCancerous Actinic Keratosis clear today at left Vermillion lip. - these may recur and new lesions may form requiring treatment to prevent transformation into skin cancer - observe for new or changing spots and contact [place] for appointment if occur - photoprotection with sun protective clothing; sunglasses and broad spectrum sunscreen with SPF of at least 30 + and frequent self skin exams recommended - yearly exams by a dermatologist recommended for persons with history of PreCancerous Actinic Keratoses  Actinic Damage - chronic, secondary to cumulative UV radiation exposure/sun exposure over time - diffuse scaly erythematous macules with underlying dyspigmentation - Recommend daily broad spectrum sunscreen SPF 30+ to sun-exposed areas, reapply every 2 hours as needed.  - Recommend staying in the shade or wearing long sleeves, sun glasses (UVA+UVB protection) and wide brim hats (4-inch brim around the entire circumference of the hat). - Call for new or  changing lesions.  Return in about 1 year (around 09/12/2022) for tbse. I, Ruthell Rummage, CMA, am acting as scribe for Brendolyn Patty, MD.  Documentation: I have reviewed the above documentation for accuracy and completeness, and I agree with the above.  Brendolyn Patty MD

## 2021-09-12 NOTE — Patient Instructions (Addendum)
Seborrheic Keratosis  What causes seborrheic keratoses? Seborrheic keratoses are harmless, common skin growths that first appear during adult life.  As time goes by, more growths appear.  Some people may develop a large number of them.  Seborrheic keratoses appear on both covered and uncovered body parts.  They are not caused by sunlight.  The tendency to develop seborrheic keratoses can be inherited.  They vary in color from skin-colored to gray, brown, or even black.  They can be either smooth or have a rough, warty surface.   Seborrheic keratoses are superficial and look as if they were stuck on the skin.  Under the microscope this type of keratosis looks like layers upon layers of skin.  That is why at times the top layer may seem to fall off, but the rest of the growth remains and re-grows.    Treatment Seborrheic keratoses do not need to be treated, but can easily be removed in the office.  Seborrheic keratoses often cause symptoms when they rub on clothing or jewelry.  Lesions can be in the way of shaving.  If they become inflamed, they can cause itching, soreness, or burning.  Removal of a seborrheic keratosis can be accomplished by freezing, burning, or surgery. If any spot bleeds, scabs, or grows rapidly, please return to have it checked, as these can be an indication of a skin cancer.   Actinic keratoses are precancerous spots that appear secondary to cumulative UV radiation exposure/sun exposure over time. They are chronic with expected duration over 1 year. A portion of actinic keratoses will progress to squamous cell carcinoma of the skin. It is not possible to reliably predict which spots will progress to skin cancer and so treatment is recommended to prevent development of skin cancer.  Recommend daily broad spectrum sunscreen SPF 30+ to sun-exposed areas, reapply every 2 hours as needed.  Recommend staying in the shade or wearing long sleeves, sun glasses (UVA+UVB protection) and wide  brim hats (4-inch brim around the entire circumference of the hat). Call for new or changing lesions.   Cryotherapy Aftercare  Wash gently with soap and water everyday.   Apply Vaseline and Band-Aid daily until healed.   Rosacea  What is rosacea? Rosacea (say: ro-zay-sha) is a common skin disease that usually begins as a trend of flushing or blushing easily.  As rosacea progresses, a persistent redness in the center of the face will develop and may gradually spread beyond the nose and cheeks to the forehead and chin.  In some cases, the ears, chest, and back could be affected.  Rosacea may appear as tiny blood vessels or small red bumps that occur in crops.  Frequently they can contain pus, and are called "pustules".  If the bumps do not contain pus, they are referred to as "papules".  Rarely, in prolonged, untreated cases of rosacea, the oil glands of the nose and cheeks may become permanently enlarged.  This is called rhinophyma, and is seen more frequently in men.  Signs and Risks In its beginning stages, rosacea tends to come and go, which makes it difficult to recognize.  It can start as intermittent flushing of the face.  Eventually, blood vessels may become permanently visible.  Pustules and papules can appear, but can be mistaken for adult acne.  People of all races, ages, genders and ethnic groups are at risk of developing rosacea.  However, it is more common in women (especially around menopause) and adults with fair skin between the ages  of 30 and 50.  Treatment Dermatologists typically recommend a combination of treatments to effectively manage rosacea.  Treatment can improve symptoms and may stop the progression of the rosacea.  Treatment may involve both topical and oral medications.  The tetracycline antibiotics are often used for their anti-inflammatory effect; however, because of the possibility of developing antibiotic resistance, they should not be used long term at full dose.   For dilated blood vessels the options include electrodessication (uses electric current through a small needle), laser treatment, and cosmetics to hide the redness.   With all forms of treatment, improvement is a slow process, and patients may not see any results for the first 3-4 weeks.  It is very important to avoid the sun and other triggers.  Patients must wear sunscreen daily.  Skin Care Instructions: Cleanse the skin with a mild soap such as CeraVe cleanser, Cetaphil cleanser, or Dove soap once or twice daily as needed. Moisturize with Eucerin Redness Relief Daily Perfecting Lotion (has a subtle green tint), CeraVe Moisturizing Cream, or Oil of Olay Daily Moisturizer with sunscreen every morning and/or night as recommended. Makeup should be "non-comedogenic" (won't clog pores) and be labeled "for sensitive skin". Good choices for cosmetics are: Neutrogena, Almay, and Physician's Formula.  Any product with a green tint tends to offset a red complexion. If your eyes are dry and irritated, use artificial tears 2-3 times per day and cleanse the eyelids daily with baby shampoo.  Have your eyes examined at least every 2 years.  Be sure to tell your eye doctor that you have rosacea. Alcoholic beverages tend to cause flushing of the skin, and may make rosacea worse. Always wear sunscreen, protect your skin from extreme hot and cold temperatures, and avoid spicy foods, hot drinks, and mechanical irritation such as rubbing, scrubbing, or massaging the face.  Avoid harsh skin cleansers, cleansing masks, astringents, and exfoliation. If a particular product burns or makes your face feel tight, then it is likely to flare your rosacea. If you are having difficulty finding a sunscreen that you can tolerate, you may try switching to a chemical-free sunscreen.  These are ones whose active ingredient is zinc oxide or titanium dioxide only.  They should also be fragrance free, non-comedogenic, and labeled for sensitive  skin. Rosacea triggers may vary from person to person.  There are a variety of foods that have been reported to trigger rosacea.  Some patients find that keeping a diary of what they were doing when they flared helps them avoid triggers.    Gentle Skin Care Guide  1. Bathe no more than once a day.  2. Avoid bathing in hot water  3. Use a mild soap like Dove, Vanicream, Cetaphil, CeraVe. Can use Lever 2000 or Cetaphil antibacterial soap  4. Use soap only where you need it. On most days, use it under your arms, between your legs, and on your feet. Let the water rinse other areas unless visibly dirty.  5. When you get out of the bath/shower, use a towel to gently blot your skin dry, don't rub it.  6. While your skin is still a little damp, apply a moisturizing cream such as Vanicream, CeraVe, Cetaphil, Eucerin, Sarna lotion or plain Vaseline Jelly. For hands apply Neutrogena [place] Hand Cream or Excipial Hand Cream.  7. Reapply moisturizer any time you start to itch or feel dry.  8. Sometimes using free and clear laundry detergents can be helpful. Fabric softener sheets should be avoided. [place]  Gentle liquid, or any liquid fabric softener that is free of dyes and perfumes, it acceptable to use  9. If your doctor has given you prescription creams you may apply moisturizers over them   Melanoma ABCDEs  Melanoma is the most dangerous type of skin cancer, and is the leading cause of death from skin disease.  You are more likely to develop melanoma if you: Have light-colored skin, light-colored eyes, or red or blond hair Spend a lot of time in the sun Tan regularly, either outdoors or in a tanning bed Have had blistering sunburns, especially during childhood Have a close family member who has had a melanoma Have atypical moles or large birthmarks  Early detection of melanoma is key since treatment is typically straightforward and cure rates are extremely high if we catch it  early.   The first sign of melanoma is often a change in a mole or a new dark spot.  The ABCDE system is a way of remembering the signs of melanoma.  A for asymmetry:  The two halves do not match. B for border:  The edges of the growth are irregular. C for color:  A mixture of colors are present instead of an even brown color. D for diameter:  Melanomas are usually (but not always) greater than 19mm - the size of a pencil eraser. E for evolution:  The spot keeps changing in size, shape, and color.  Please check your skin once per month between visits. You can use a small mirror in front and a large mirror behind you to keep an eye on the back side or your body.   If you see any new or changing lesions before your next follow-up, please call to schedule a visit.  Please continue daily skin protection including broad spectrum sunscreen SPF 30+ to sun-exposed areas, reapplying every 2 hours as needed when you're outdoors.       +If you have any questions or concerns for your doctor, please call our main line at [phone] and press option 4 to reach your doctor's medical assistant. If no one answers, please leave a voicemail as directed and we will return your call as soon as possible. Messages left after 4 pm will be answered the following business day.   You may also send [place] a message via [place]. We typically respond to MyChart messages within 1-2 business days.  For prescription refills, please ask your pharmacy to contact our office. Our fax number is [phone].  If you have an urgent issue when the clinic is closed that cannot wait until the next business day, you can page your doctor at the number below.    Please note that while we do our best to be available for urgent issues outside of office hours, we are not available 24/7.   If you have an urgent issue and are unable to reach [place], you may choose to seek medical care at your doctor's office, retail clinic, urgent care center,  or emergency room.  If you have a medical emergency, please immediately call 911 or go to the emergency department.  Pager Numbers  - Dr. Nehemiah Massed: [phone]  - Dr. Laurence Ferrari: [phone]  - Dr. Nicole Kindred: [phone]  In the event of inclement weather, please call our main line at [phone] for an update on the status of any delays or closures.  Dermatology Medication Tips: Please keep the boxes that topical medications come in in order to help keep track of the instructions about where  and how to use these. Pharmacies typically print the medication instructions only on the boxes and not directly on the medication tubes.   If your medication is too expensive, please contact our office at [phone] option 4 or send [place] a message through [place].   We are unable to tell what your co-pay for medications will be in advance as this is different depending on your insurance coverage. However, we may be able to find a substitute medication at lower cost or fill out paperwork to get insurance to cover a needed medication.   If a prior authorization is required to get your medication covered by your insurance company, please allow [place] 1-2 business days to complete this process.  Drug prices often vary depending on where the prescription is filled and some pharmacies may offer cheaper prices.  The website www.goodrx.com contains coupons for medications through different pharmacies. The prices here do not account for what the cost may be with help from insurance (it may be cheaper with your insurance), but the website can give you the price if you did not use any insurance.  - You can print the associated coupon and take it with your prescription to the pharmacy.  - You may also stop by our office during regular business hours and pick up a GoodRx coupon card.  - If you need your prescription sent electronically to a different pharmacy, notify our office through [facility] or by phone at  [phone] option 4.

## 2022-06-04 ENCOUNTER — Other Ambulatory Visit: Payer: Self-pay | Admitting: Internal Medicine

## 2022-09-18 ENCOUNTER — Ambulatory Visit: Payer: Medicare Other | Admitting: Dermatology
